# Patient Record
Sex: Male | Born: 1990
Health system: Southern US, Community
[De-identification: ages and names within clinical notes are randomized; demographics above are authoritative.]

## PROBLEM LIST (undated history)

## (undated) DIAGNOSIS — F32A Depression, unspecified: Secondary | ICD-10-CM

## (undated) DIAGNOSIS — Q676 Pectus excavatum: Secondary | ICD-10-CM

## (undated) DIAGNOSIS — F419 Anxiety disorder, unspecified: Secondary | ICD-10-CM

## (undated) DIAGNOSIS — R29898 Other symptoms and signs involving the musculoskeletal system: Secondary | ICD-10-CM

## (undated) DIAGNOSIS — F329 Major depressive disorder, single episode, unspecified: Secondary | ICD-10-CM

## (undated) DIAGNOSIS — K4090 Unilateral inguinal hernia, without obstruction or gangrene, not specified as recurrent: Secondary | ICD-10-CM

## (undated) HISTORY — DX: Major depressive disorder, single episode, unspecified: F32.9

## (undated) HISTORY — DX: Anxiety disorder, unspecified: F41.9

## (undated) HISTORY — DX: Depression, unspecified: F32.A

---
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
## 2008-09-22 HISTORY — PX: PECTUS BAR REMOVAL: SHX2183

## 2012-05-28 ENCOUNTER — Emergency Department (HOSPITAL_COMMUNITY)
Admission: EM | Admit: 2012-05-28 | Discharge: 2012-05-29 | Disposition: A | Discharge status: Home / Self Care | Payer: BC Managed Care – PPO | Attending: Emergency Medicine | Admitting: Emergency Medicine

## 2012-05-28 DIAGNOSIS — M542 Cervicalgia: Secondary | ICD-10-CM

## 2012-05-28 HISTORY — DX: Pectus excavatum: Q67.6

## 2012-05-29 ENCOUNTER — Emergency Department (HOSPITAL_COMMUNITY): Payer: BC Managed Care – PPO

## 2012-05-29 ENCOUNTER — Encounter (HOSPITAL_COMMUNITY): Payer: Self-pay | Admitting: *Deleted

## 2012-05-29 MED ORDER — CYCLOBENZAPRINE HCL 10 MG PO TABS: 10.0000 mg | ORAL_TABLET | Freq: Two times a day (BID) | ORAL | Status: AC | PRN

## 2012-05-29 MED ORDER — LORAZEPAM 1 MG PO TABS
1.0000 mg | ORAL_TABLET | Freq: Once | ORAL | Status: AC
  Administered 2012-05-29: 1 mg via ORAL
  Filled 2012-05-29: qty 1

## 2012-05-29 MED ORDER — HYDROXYZINE HCL 25 MG PO TABS: 25.0000 mg | ORAL_TABLET | Freq: Four times a day (QID) | ORAL | Status: AC

## 2012-05-29 MED ORDER — IBUPROFEN 800 MG PO TABS: 800.0000 mg | ORAL_TABLET | Freq: Three times a day (TID) | ORAL | Status: AC

## 2012-05-29 NOTE — ED Provider Notes (Signed)
History     CSN: [id number]  Arrival date & time 05/28/12  2357   First MD Initiated Contact with Patient 05/29/12 0015      Chief Complaint  Patient presents with  . Neck Pain    (Consider location/radiation/quality/duration/timing/severity/associated sxs/prior treatment) HPI HX per PT. R sided neck discomfort for the last 24 hours, no truama or injury, had been doing some stretching the day before but does not recall injuring it. He went to an urgent care and was told that he may have Ehlers Danlos Syndrome (his mother was diagnosed with this and he has h/o pectus excavatum). He went home and had been reading extensively about this syndrome, became anxious, short of breath, told his sig other that he feels like he is going to die and she brought him here for evaluation.  He no longer has any neck pain, no associated weakness or numbness. He states he still feels anxious but no h/o panic attack or anxiety.  Past Medical History  Diagnosis Date  . Pectus excavatum   . Pneumothorax     Past Surgical History  Procedure Date  . Nuss procedure     History reviewed. No pertinent family history.  History  Substance Use Topics  . Smoking status: Not on file  . Smokeless tobacco: Not on file  . Alcohol Use: Yes     occasionally      Review of Systems  Constitutional: Negative for fever and chills.  HENT: Negative for neck stiffness.   Eyes: Negative for pain.  Respiratory: Negative for shortness of breath.   Cardiovascular: Negative for chest pain.  Gastrointestinal: Negative for abdominal pain.  Genitourinary: Negative for dysuria.  Musculoskeletal: Negative for back pain, joint swelling and gait problem.  Skin: Negative for rash.  Neurological: Negative for headaches.  Psychiatric/Behavioral: The patient is nervous/anxious.   All other systems reviewed and are negative.    Allergies  Shellfish allergy  Home Medications  No current outpatient prescriptions on  file.  BP 135/76  Pulse 99  Temp 97.7 F (36.5 C) (Oral)  Resp 16  Ht 6\' 1"  (1.854 m)  Wt 180 lb (81.647 kg)  BMI 23.75 kg/m2  SpO2 98%  Physical Exam  Constitutional: He is oriented to person, place, and time. He appears well-developed and well-nourished.  HENT:  Head: Normocephalic and atraumatic.  Eyes: Conjunctivae and EOM are normal. Pupils are equal, round, and reactive to light.  Neck: Trachea normal and normal range of motion. Neck supple. No thyromegaly present.       Localizes discomfort to R paracervical upper C spine without any deformity, no tenderness, and no apparent abnormality on exam.   Cardiovascular: Normal rate, regular rhythm, S1 normal, S2 normal and normal pulses.     No systolic murmur is present   No diastolic murmur is present  Pulses:      Radial pulses are 2+ on the right side, and 2+ on the left side.  Pulmonary/Chest: Effort normal and breath sounds normal. He has no wheezes. He has no rhonchi. He has no rales. He exhibits no tenderness.  Abdominal: Soft. Normal appearance and bowel sounds are normal. There is no tenderness. There is no CVA tenderness and negative Murphy's sign.  Musculoskeletal:       Hyper-flexible joints, hyperelastic skin  Neurological: He is alert and oriented to person, place, and time. He has normal strength. No cranial nerve deficit or sensory deficit. GCS eye subscore is 4. GCS verbal subscore is 5.  GCS motor subscore is 6.  Skin: Skin is warm and dry. No rash noted. He is not diaphoretic.  Psychiatric: His speech is normal.       Cooperative and appropriate    ED Course  Procedures (including critical care time)  Given degree of anxiety, I offered ativan imaging of his neck. PT declines imaging and agrees to have ativan.   On exam has no sig tenderness or N/V deficits. Although he does carry the diagnosis of Ehlers Danlos syndrome, he does have some features of it on exam with hyper-flexible joints in his fingers,  hyperelastic skin. He is aware of risk of joint problems with the syndrome, especially in his neck but does not feel like he needs an xray or other imaging at this time. PCP referral provided.   PT changed his mind and xray ordered and reviewed as below  Dg Cervical Spine Complete  05/29/2012  *RADIOLOGY REPORT*  Clinical Data: Right posterior neck pain.  Possible straining during yoga.  CERVICAL SPINE - COMPLETE 4+ VIEW  Comparison: None.  Findings: There is slight reversal of the usual cervical lordosis and angulation of the cervical spine towards the left which may be due to patient positioning but can be also seen with ligamentous injury or muscle spasm.  No abnormal anterior subluxation.  Facet joints demonstrate normal alignment.  No vertebral compression deformities.  No prevertebral soft tissue swelling.  Intervertebral disc space heights are maintained.  Lateral masses of C1 appear symmetrical.  The odontoid process appears intact.  IMPRESSION: Reversal of the usual cervical lordosis and angulation of the cervical spine towards the left could be due to patient positioning but ligamentous injury or muscle spasm can also have this appearance.  No displaced fractures identified.   Original Report Authenticated By: Marlon Pel, M.D.    Xray results shared with PT. Plan outpatient follow up for further evaluation. Ambulates to restroom NAD and no change normal neuro exam. Presentation does not suggest dissection, and exam does suggest need for emergent MRI or CT scan.  MDM   VS and nursing notes reviewed. Imaging and PO Ativan. Outpatient referrals.        Sunnie Nielsen, MD 05/29/12 [phone]

## 2012-05-29 NOTE — ED Notes (Signed)
Pt c/o sharp neck pain on R side which he relates to possible yoga injury, occurring yesterday. Pt states continued and unimproved pain and he feels light-headed and 'out of it'.

## 2012-06-15 ENCOUNTER — Ambulatory Visit: Payer: BC Managed Care – PPO

## 2012-06-15 ENCOUNTER — Ambulatory Visit: Payer: BC Managed Care – PPO | Admitting: Family Medicine

## 2012-06-15 VITALS — BP 130/70 | HR 70 | Temp 98.1°F | Resp 16 | Ht 73.0 in | Wt 171.0 lb

## 2012-06-15 DIAGNOSIS — R5383 Other fatigue: Secondary | ICD-10-CM

## 2012-06-15 DIAGNOSIS — S139XXA Sprain of joints and ligaments of unspecified parts of neck, initial encounter: Secondary | ICD-10-CM

## 2012-06-15 DIAGNOSIS — R5381 Other malaise: Secondary | ICD-10-CM

## 2012-06-15 DIAGNOSIS — R55 Syncope and collapse: Secondary | ICD-10-CM

## 2012-06-15 DIAGNOSIS — R42 Dizziness and giddiness: Secondary | ICD-10-CM

## 2012-06-15 LAB — POCT CBC
Hemoglobin: 15.6 g/dL (ref 14.1–18.1)
Lymph, poc: 2.7 (ref 0.6–3.4)
MCH, POC: 29.4 pg (ref 27–31.2)
MCHC: 32.3 g/dL (ref 31.8–35.4)
MCV: 91 fL (ref 80–97)
MID (cbc): 0.4 (ref 0–0.9)
MPV: 9 fL (ref 0–99.8)
POC MID %: 6.2 %M (ref 0–12)
RBC: 5.31 M/uL (ref 4.69–6.13)
WBC: 6.3 10*3/uL (ref 4.6–10.2)

## 2012-06-15 NOTE — Progress Notes (Signed)
Subjective:    Patient ID: Jared Alvarado, male    DOB: Aug 21, 1991, 21 y.o.   MRN: [id number]  HPI  3 wks prev pt went to ER w/ neck pain  - felt really out of it, disconnected to reality, like he was going pass out - whenever he bent his neck back as far as he could it was reproduce these symptoms - induces panic attack and anxiety, like he is presyncopal.  Pain with looking to either side or down. Thinks perhaps he had hurt his neck doing yoga as lateral that day dev intense pain at R side of neck, worsening for 24 hrs prior to ER visit. The same feeling of panic/anxiety/dizziness last night - unsure if a head movement triggered this. Prior to this pt has never had any injury. Has nuss procedure for pectus excavatum at 21 yo and removed at 21 yo. He did have a pneumothorax and he did have similar symptoms then. He wants to make sure that nothing else is going on.  Did some light weight exercises and gentle stretching to neck w/ her personal trainer last wk and symptoms seem to be relieved some but now pain is worse and he had another panic attack last night.  At the ER he was prescribed meds but didn't fill until last night - hydroxyzine and flexeril. Tried one hydroxyzine today.   Hard to separate anxiety and pain anymore as one seems to trigger another.  Has had extreme [facility] but unsure if induced by anxiety or vice versa. Right before he falls asleep jolts awake w/ panic. Very difficult to find position to not induce this.  Saw ortho today - called and got worked in - so he suggested PCP, rheumatologist due to diffuse body pain and hypermobile joints and a neurologist as. He has pain all over his body. Was referred to PCP Dr. Allyne Gee.  Has extreme tightness in mouth which causes difficulty breathing at night while sleeping. Pains, pale skin, hypermobile - concerned about Ehler-Danlos syndrome.  He has been tested for Marfans syndrome in the past by a cardiologist as he had MVP but this  resolved after his nuss procedure relieved the sternal pressure on his heart.    Review of Systems  Constitutional: Positive for fatigue. Negative for fever.  HENT: Positive for neck pain and neck stiffness.   Respiratory: Positive for shortness of breath. Negative for cough.   Cardiovascular: Negative for chest pain and leg swelling.  Gastrointestinal: Negative for diarrhea and constipation.  Musculoskeletal: Positive for myalgias and arthralgias. Negative for back pain, joint swelling and gait problem.  Skin: Positive for pallor. Negative for color change and rash.  Neurological: Positive for dizziness and light-headedness.  Psychiatric/Behavioral: Positive for disturbed wake/sleep cycle and decreased concentration. Negative for behavioral problems and dysphoric mood. The patient is nervous/anxious.        Objective:   Physical Exam  Vitals reviewed. Constitutional: He is oriented to person, place, and time. He appears well-developed and well-nourished. No distress.  HENT:  Head: Normocephalic and atraumatic.  Right Ear: External ear normal.  Left Ear: External ear normal.  Eyes: Conjunctivae normal are normal. No scleral icterus.  Neck: Neck supple. No thyromegaly present.  Cardiovascular: Normal rate, regular rhythm, normal heart sounds and intact distal pulses.   No murmur heard. Pulses:      Carotid pulses are 2+ on the right side, and 2+ on the left side. Pulmonary/Chest: Effort normal and breath sounds normal. No respiratory distress.  Musculoskeletal:  He exhibits no edema.  Lymphadenopathy:    He has no cervical adenopathy.  Neurological: He is alert and oriented to person, place, and time.  Skin: Skin is warm and dry. He is not diaphoretic. No erythema. There is pallor.  Psychiatric: He has a normal mood and affect. His behavior is normal.      Results for orders placed in visit on 06/15/12  POCT CBC      Component Value Range   WBC 6.3  4.6 - 10.2 K/uL    Lymph, poc 2.7  0.6 - 3.4   POC LYMPH PERCENT 42.1  10 - 50 %L   MID (cbc) 0.4  0 - 0.9   POC MID % 6.2  0 - 12 %M   POC Granulocyte 3.3  2 - 6.9   Granulocyte percent 51.7  37 - 80 %G   RBC 5.31  4.69 - 6.13 M/uL   Hemoglobin 15.6  14.1 - 18.1 g/dL   HCT, POC 04.5  40.9 - 53.7 %   MCV 91.0  80 - 97 fL   MCH, POC 29.4  27 - 31.2 pg   MCHC 32.3  31.8 - 35.4 g/dL   RDW, POC 81.1     Platelet Count, POC 258  142 - 424 K/uL   MPV 9.0  0 - 99.8 fL  POCT SEDIMENTATION RATE      Component Value Range   POCT SED RATE 5  0 - 22 mm/hr     CXR: hyperinflation with flattened diaphragms, no acute abnormalities. Assessment & Plan:  1. SHoB - Pt reassured that no acute concerns seen.  Agree w/ pt that more complete eval for Ehlers-Danlos is reasonable. Will defer to the PCP and specialists that he has been referred to but pt welcome to return if he would like to proceed. 2. Presyncope - Agreed w/ pt that he needed to start on a thorough w/u for possible causes of his lightheadedness, presyncope w/ full extension of his neck - eval of vagal nerve and vertebral artery likely helpful. As no acute issues, will defer w/u to his PCP and specialists (has neuro and rheum appts pending) as discussed above but pt welcome to return if sxs continue or worsen.

## 2012-10-18 ENCOUNTER — Other Ambulatory Visit: Payer: Self-pay | Admitting: Family Medicine

## 2012-10-18 DIAGNOSIS — B356 Tinea cruris: Secondary | ICD-10-CM

## 2012-10-18 MED ORDER — NYSTATIN 100000 UNIT/GM EX CREA: TOPICAL_CREAM | Freq: Two times a day (BID) | CUTANEOUS | Status: DC

## 2012-10-18 MED ORDER — FLUCONAZOLE 150 MG PO TABS: 150.0000 mg | ORAL_TABLET | Freq: Once | ORAL | Status: DC

## 2012-10-25 ENCOUNTER — Ambulatory Visit: Payer: BC Managed Care – PPO | Admitting: Family Medicine

## 2012-10-25 VITALS — BP 126/72 | HR 88 | Temp 98.6°F | Resp 16 | Ht 73.0 in | Wt 173.0 lb

## 2012-10-25 DIAGNOSIS — J069 Acute upper respiratory infection, unspecified: Secondary | ICD-10-CM

## 2012-10-25 DIAGNOSIS — H6980 Other specified disorders of Eustachian tube, unspecified ear: Secondary | ICD-10-CM

## 2012-10-25 DIAGNOSIS — M5382 Other specified dorsopathies, cervical region: Secondary | ICD-10-CM

## 2012-10-25 LAB — POCT CBC
Granulocyte percent: 72.9 %G (ref 37–80)
HCT, POC: 45.1 % (ref 43.5–53.7)
Lymph, poc: 1.2 (ref 0.6–3.4)
MCHC: 32.6 g/dL (ref 31.8–35.4)
MPV: 8.3 fL (ref 0–99.8)
POC Granulocyte: 4.4 (ref 2–6.9)
POC LYMPH PERCENT: 19.3 %L (ref 10–50)
POC MID %: 7.8 %M (ref 0–12)
Platelet Count, POC: 235 10*3/uL (ref 142–424)
RDW, POC: 13.6 %

## 2012-10-25 MED ORDER — FLUTICASONE PROPIONATE 50 MCG/ACT NA SUSP: 2.0000 | Freq: Every day | NASAL | Status: DC

## 2012-10-25 NOTE — Progress Notes (Signed)
Subjective:    Patient ID: Jared Alvarado, male    DOB: 1991-02-13, 22 y.o.   MRN: [id number]  HPI  Jared Alvarado is a 22 yo who over the past 6 mos has had increasing problems with his neck. His neck felt weak and like it couldn't hold his head up. I saw him for this last Sept and prior to that he had been seen at [facility], [facility], and he also ended up seeing a general practitioner (only 1 time, he does not remember who or where), a orthopedic surgeon, a neurologist, AND a rheumatologist for this.  He does not really know what came of all this evaluaiton - he had several sets of imaging done and a lot of labs and ended up being told that it was muscular in origin - like a really severe muscle spasm, which is fine with him if that is the accurate diagnosis - but he is not completely convinced of this.  He felt like someone was stabbing him in the back of his neck constantly and that his neck was weak.  He would have episodes when his neck was bothering him so much that he felt like he couldn't understand people, could not process what was being said to him, brightness of the room would bother him, sometimes the room felt like it was moving, felt like he was going to pass out.  Had a headache that felt like tightness over his head.  He had one episodes where all of a sudden both of his arms went numb and so he called the neurologist who told him he could be having a dissection so went to the ER for a stat CT which was fortunately normal then sxs resolved spontaneously after an hr - he still doesn't know what caused it.  His sxs were so weird and severe and no doctor could tell him what was wrong with him that he actually became suicidal for a few mos - still tears up thinking/talking about how worried and hopeless it seemed. When his neck problems started he has lost about 35 lbs in 2 mos - he had been working out a lot and eating a high calorie diet with lots of protein - then he decided that he didn't want to be so bulked  up so switched to a normal calorie diet (like 1/3 of what he was eating before) and just did cardio. In the past sev mos, his symptoms have gotten much better - he thinks because maybe he has been working out again which was very painful to his neck initially - until today, when he was sitting at work and all of a sudden the sxs happened again - felt like he couldn't concentrate, became dizzy while looking at the computer screen, felt like his neck was going to be unable to hold his head up - sxs were so severe, he had to leave work and came here for further eval. He does not remember any of the doctors he saw and does not really have any info from his prior w/us. He has tried sev different muscle relaxants for his neck - none of which seemed to help at all He still feels that something is very wrong with his body.  This is concerning to him because he has never had anxiety or concerns about his health in the past.  In fact, he had a pneumothorax for a month and he reports he just knew that his breathing and chest didn't  feel right so saw multiple doctors - feels like he finally sort of forced one into doing a CXR - and thereby found out that his lung was partially collapsed and he actually have to have a chest tube placed. Reports he didn't feel this level of depression/anxiety/concern with that incident even though he knew something was wrong when doctors weren't finding it so doesn't know why he is feeling this with this condition. He did have a h/o pectus excavatum s/p nuss procedure which has left his right chest larger than his left - had deformed his rib cage and he wonders if this could be playing into his symptoms. He has not worked out at all in the past several wks and wonders if that could be why he had sxs recur today. Also, he is having some pain over the area of his prior chest tube. Incidentally, he also thinks he is getting a cold, his girlfriend developed a cold about 4d ago and he did take  some allegra d this a.m. to help w/ the sxs.  Past Medical History  Diagnosis Date  . Pectus excavatum   . Pneumothorax    Current Outpatient Prescriptions on File Prior to Visit  Medication Sig Dispense Refill  . fexofenadine (ALLEGRA) 180 MG tablet Take 180 mg by mouth daily.      Marland Kitchen acetaminophen (TYLENOL) 325 MG tablet Take 325 mg by mouth every 6 (six) hours as needed. For pain      . fluticasone (FLONASE) 50 MCG/ACT nasal spray Place 2 sprays into the nose daily.  16 g  6  . nystatin cream (MYCOSTATIN) Apply topically 2 (two) times daily.  30 g  11   Allergies  Allergen Reactions  . Shellfish Allergy      Review of Systems  Constitutional: Positive for unexpected weight change. Negative for fever, chills, activity change and appetite change.  HENT: Positive for neck pain and neck stiffness.   Eyes: Positive for photophobia.  Respiratory: Negative for cough and shortness of breath.   Cardiovascular: Negative for chest pain.  Genitourinary: Positive for flank pain. Negative for dysuria, urgency, hematuria, decreased urine volume, discharge and difficulty urinating.  Musculoskeletal: Positive for myalgias and arthralgias. Negative for back pain, joint swelling and gait problem.  Skin: Negative for rash.  Neurological: Positive for dizziness, weakness, light-headedness and headaches. Negative for facial asymmetry and numbness.  Psychiatric/Behavioral: Positive for suicidal ideas, confusion, sleep disturbance, dysphoric mood and decreased concentration. Negative for self-injury and agitation. The patient is nervous/anxious. The patient is not hyperactive.       BP 126/72  Pulse 88  Temp 98.6 F (37 C) (Oral)  Resp 16  Ht 6\' 1"  (1.854 m)  Wt 173 lb (78.472 kg)  BMI 22.82 kg/m2 Objective:   Physical Exam  Constitutional: He is oriented to person, place, and time. He appears well-developed and well-nourished. No distress.  HENT:  Head: Normocephalic and atraumatic.  Right  Ear: External ear and ear canal normal. A middle ear effusion is present.  Left Ear: External ear and ear canal normal. Tympanic membrane is retracted. A middle ear effusion is present.  Nose: Nose normal.  Mouth/Throat: Oropharynx is clear and moist and mucous membranes are normal. No oropharyngeal exudate.  Eyes: Conjunctivae normal are normal. No scleral icterus.  Neck: Normal range of motion. Neck supple. No thyromegaly present.  Cardiovascular: Normal rate, regular rhythm, normal heart sounds and intact distal pulses.   Pulmonary/Chest: Effort normal and breath sounds normal. No respiratory  distress.  Abdominal: Soft. Bowel sounds are normal. He exhibits no distension and no mass. There is no tenderness. There is no rebound and no guarding.  Musculoskeletal: He exhibits no edema.       Cervical back: He exhibits pain and spasm. He exhibits normal range of motion and no bony tenderness.       Tenderness over right paraspinal cervical muscles  Lymphadenopathy:    He has no cervical adenopathy.  Neurological: He is alert and oriented to person, place, and time. He has normal strength and normal reflexes. He displays no atrophy and normal reflexes. No sensory deficit. He exhibits normal muscle tone. Coordination and gait normal.  Skin: Skin is warm and dry. He is not diaphoretic. No erythema.  Psychiatric: He has a normal mood and affect. His behavior is normal.      Results for orders placed in visit on 10/25/12  POCT CBC      Component Value Range   WBC 6.0  4.6 - 10.2 K/uL   Lymph, poc 1.2  0.6 - 3.4   POC LYMPH PERCENT 19.3  10 - 50 %L   MID (cbc) 0.5  0 - 0.9   POC MID % 7.8  0 - 12 %M   POC Granulocyte 4.4  2 - 6.9   Granulocyte percent 72.9  37 - 80 %G   RBC 4.93  4.69 - 6.13 M/uL   Hemoglobin 14.7  14.1 - 18.1 g/dL   HCT, POC 16.1  09.6 - 53.7 %   MCV 91.5  80 - 97 fL   MCH, POC 29.8  27 - 31.2 pg   MCHC 32.6  31.8 - 35.4 g/dL   RDW, POC 04.5     Platelet Count, POC 235   142 - 424 K/uL   MPV 8.3  0 - 99.8 fL    Assessment & Plan:   1. URI (upper respiratory infection)  POCT CBC  2. Neck muscle weakness  Ambulatory referral to Physical Therapy  3. Eustachian tube dysfunction  fluticasone (FLONASE) 50 MCG/ACT nasal spray  Perhaps a middle ear problem could be contributing to his dizziness sxs?? Try trx w/ flonase.  By history and exam, it does seem most likely that his neck complaints are MSK in origin so I strongly suggest that pt start formal PT - perhaps even [place] or TENS treatment or other could help with his severe muscle spasms.  I also asked pt to check with his insurance co to find out the names of the other providers he has seen - GP, rheum, neuro, ortho, [facility] and come by our office to sign releases so I can gather all his prev info.  I'm sure he has had a lot of testing done but I want to make sure nothing was overlooked which can happen when a pt just has 1 visit w/ a lot of different doctors - he needs a physician to look at everything - all reports and labs - put together and make sure no obscure diagnosis (like Wilson's disease or Ehlers-Danos (which his mom has)) was overlooked. Meds ordered this encounter  Medications  . fluticasone (FLONASE) 50 MCG/ACT nasal spray    Sig: Place 2 sprays into the nose daily.    Dispense:  16 g    Refill:  6

## 2012-10-25 NOTE — Patient Instructions (Signed)
I recommend frequent warm salt water gargles, hot tea with honey and lemon, rest, and handwashing.  Hot showers or breathing in steam may help loosen the congestion.  Try a netti pot or sinus rinse is also likely to help you feel better and keep this from progressing.

## 2013-01-17 ENCOUNTER — Ambulatory Visit (INDEPENDENT_AMBULATORY_CARE_PROVIDER_SITE_OTHER): Payer: BC Managed Care – PPO | Admitting: Family Medicine

## 2013-01-17 VITALS — BP 140/80 | HR 91 | Temp 98.0°F | Resp 16 | Ht 73.5 in | Wt 180.6 lb

## 2013-01-17 DIAGNOSIS — Z0000 Encounter for general adult medical examination without abnormal findings: Secondary | ICD-10-CM

## 2013-01-17 LAB — COMPREHENSIVE METABOLIC PANEL
ALT: 20 U/L (ref 0–53)
BUN: 11 mg/dL (ref 6–23)
CO2: 28 mEq/L (ref 19–32)
Calcium: 10.2 mg/dL (ref 8.4–10.5)
Chloride: 103 mEq/L (ref 96–112)
Creat: 0.92 mg/dL (ref 0.50–1.35)
Glucose, Bld: 94 mg/dL (ref 70–99)
Total Bilirubin: 0.8 mg/dL (ref 0.3–1.2)

## 2013-01-17 LAB — POCT CBC
HCT, POC: 47.7 % (ref 43.5–53.7)
Hemoglobin: 15 g/dL (ref 14.1–18.1)
Lymph, poc: 1.7 (ref 0.6–3.4)
MCH, POC: 28.9 pg (ref 27–31.2)
MCHC: 31.4 g/dL — AB (ref 31.8–35.4)
MCV: 91.9 fL (ref 80–97)
MPV: 8.5 fL (ref 0–99.8)
RBC: 5.19 M/uL (ref 4.69–6.13)
WBC: 7.1 10*3/uL (ref 4.6–10.2)

## 2013-01-17 LAB — LIPID PANEL
Cholesterol: 165 mg/dL (ref 0–200)
Total CHOL/HDL Ratio: 3.1 Ratio
Triglycerides: 138 mg/dL (ref <150)

## 2013-01-17 LAB — HEPATITIS C ANTIBODY: HCV Ab: NEGATIVE

## 2013-01-17 LAB — HIV ANTIBODY (ROUTINE TESTING W REFLEX): HIV: NONREACTIVE

## 2013-01-17 NOTE — Progress Notes (Signed)
[facility] [address], [place] [address] [phone]  Date:  01/17/2013   Name:  Jared Alvarado   DOB:  Jan 27, 1991   MRN:  [id number]  PCP:  Norberto Sorenson, MD    Chief Complaint: complete physical   History of Present Illness:  Jared Alvarado is a 22 y.o. very pleasant male patient who presents with the following:  He needs a PE for his HSA today.  He had been SA with his long- term GF.  They were together for 6 years.  However, about one month ago he found out that she had been unfaithful and ended the relationship. This has been a tough break- up.  He does feel that he is getting better, but admits to feelings of depression.  He is still exercising regularly, and has a good support system.  He denies any SI.  He does not desire medication or formal counseling today, but will keep these things in mind.  He would like an STI check today.    He had some "jock itch" about 6 months ago and used an OTC remedy.  This seemed to clear up, but he is concerned that it may have come back.  He notes burning on the side of his penis. No discharge or other symptoms.   He also recently experienced some impotence issues.  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  and has noted some trouble with getting erection by himself as well.  He never had any problems prior to the recent break- up.    He is not fasting today. He does not smoke, occasionally uses alcohol and MJ.    He has surgery for a pneumothorax in 2008.  Otherwise he is healthy without any significant medical history.   tdap within the last 5 years per his report   There is no problem list on file for this patient.   Past Medical History  Diagnosis Date  . Pectus excavatum   . Pneumothorax   . Allergy   . Anxiety     Past Surgical History  Procedure Laterality Date  . Nuss procedure    . Nuss procedure      History  Substance Use Topics  . Smoking status: Never Smoker    . Smokeless tobacco: Never Used  . Alcohol Use: Yes     Comment: occasionally    History reviewed. No pertinent family history.  Allergies  Allergen Reactions  . Shellfish Allergy     Medication list has been reviewed and updated.  Current Outpatient Prescriptions on File Prior to Visit  Medication Sig Dispense Refill  . fexofenadine (ALLEGRA) 180 MG tablet Take 180 mg by mouth daily.      Marland Kitchen nystatin cream (MYCOSTATIN) Apply topically 2 (two) times daily.  30 g  11   No current facility-administered medications on file prior to visit.    Review of Systems:  As per HPI- otherwise negative.   Physical Examination: Filed Vitals:   01/17/13 0819  BP: 145/81  Pulse: 91  Temp: 98 F (36.7 C)  Resp: 16   Filed Vitals:   01/17/13 0819  Height: 6' 1.5" (1.867 m)  Weight: 180 lb 9.6 oz (81.92 kg)   Body mass index is 23.5 kg/(m^2). Ideal Body Weight: Weight in (lb) to have BMI = 25: 191.7  GEN: WDWN, NAD, Non-toxic, A & O x 3, well- appearing HEENT: Atraumatic, Normocephalic. Neck supple. No masses,  No LAD. Ears and Nose: No external deformity. CV: RRR, No M/G/R. No JVD. No thrill. No extra heart sounds. PULM: CTA B, no wheezes, crackles, rhonchi. No retractions. No resp. distress. No accessory muscle use. ABD: S, NT, ND, +BS. No rebound. No HSM. EXTR: No c/c/e NEURO Normal gait.  PSYCH: Normally interactive. Conversant. Not depressed or anxious appearing.  Calm demeanor.  GU: normal testicles, scroum and penis.  No sign of lesion or discharge. No masses  Assessment and Plan: Physical exam, annual - Plan: POCT CBC, Comprehensive metabolic panel, Lipid panel, GC/Chlamydia Probe Amp, Hepatitis B surface antibody, Hepatitis B surface antigen, Hepatitis C antibody, Herpes simplex virus culture, HIV antibody, HSV(herpes simplex vrs) 1+2 ab-IgG  STI testing and other BW as above.  Discussed his depression/ adjustment to break- up as above.  At this time he declines  medication or counseling.  Discussed impotence issues- very likely due to psychological stressors.  He will give this issue some time to self- resolve, but knows to seek care if this problem persists.    Signed Abbe Amsterdam, MD

## 2013-01-18 LAB — HSV(HERPES SIMPLEX VRS) I + II AB-IGG: HSV 1 Glycoprotein G Ab, IgG: <0.1 IV

## 2013-01-18 LAB — GC/CHLAMYDIA PROBE AMP: GC Probe RNA: NEGATIVE

## 2013-01-20 ENCOUNTER — Encounter: Payer: Self-pay | Admitting: Family Medicine

## 2014-02-04 IMAGING — CR DG CERVICAL SPINE COMPLETE 4+V
5 series · 5 of 5 positions shown · non-contrast
Comparison: None.

CLINICAL DATA: Right posterior neck pain.  Possible straining
during yoga.

CERVICAL SPINE - COMPLETE 4+ VIEW

[w cervical spine lat]
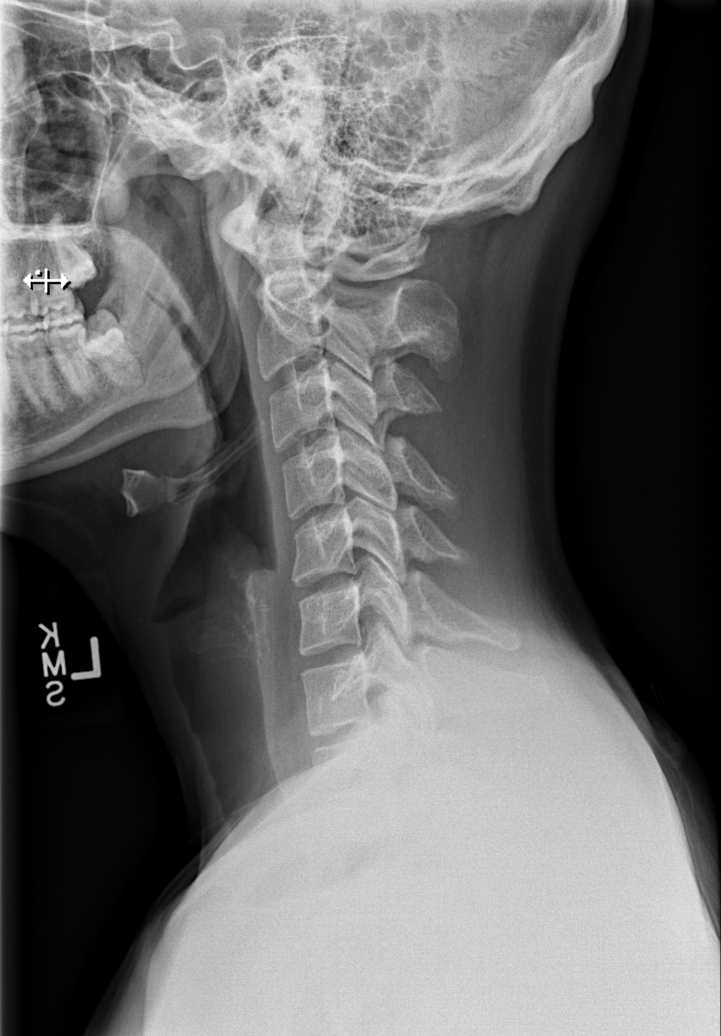

[w cervical spine ap_obl (1 of 2)]
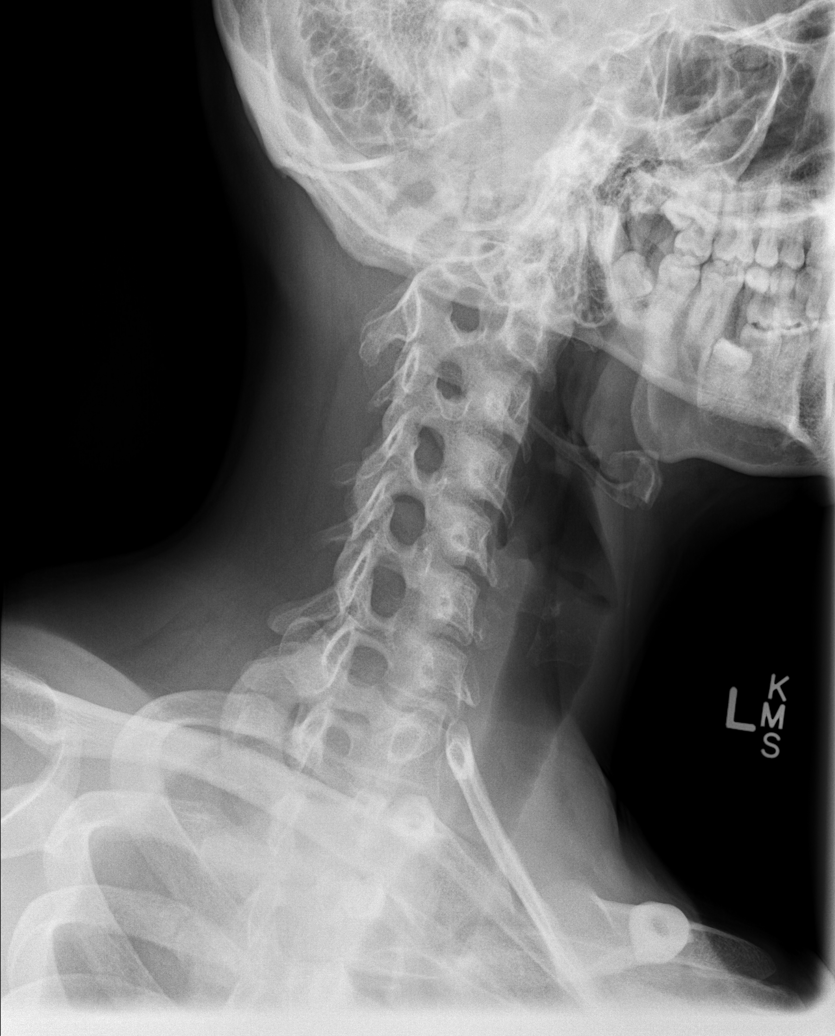

[w cervical spine ap_obl (2 of 2)]
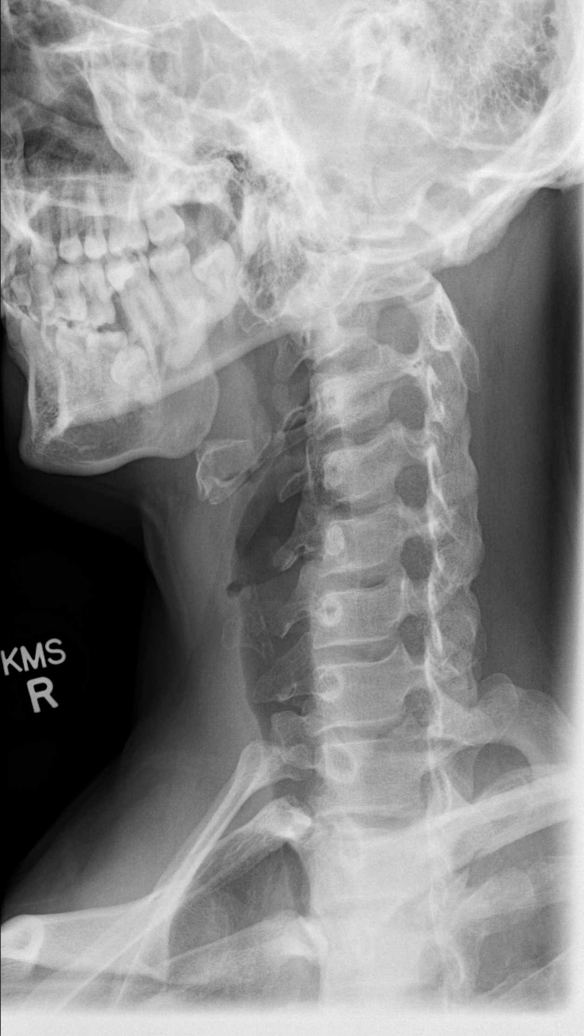

[w cervical spine ap]
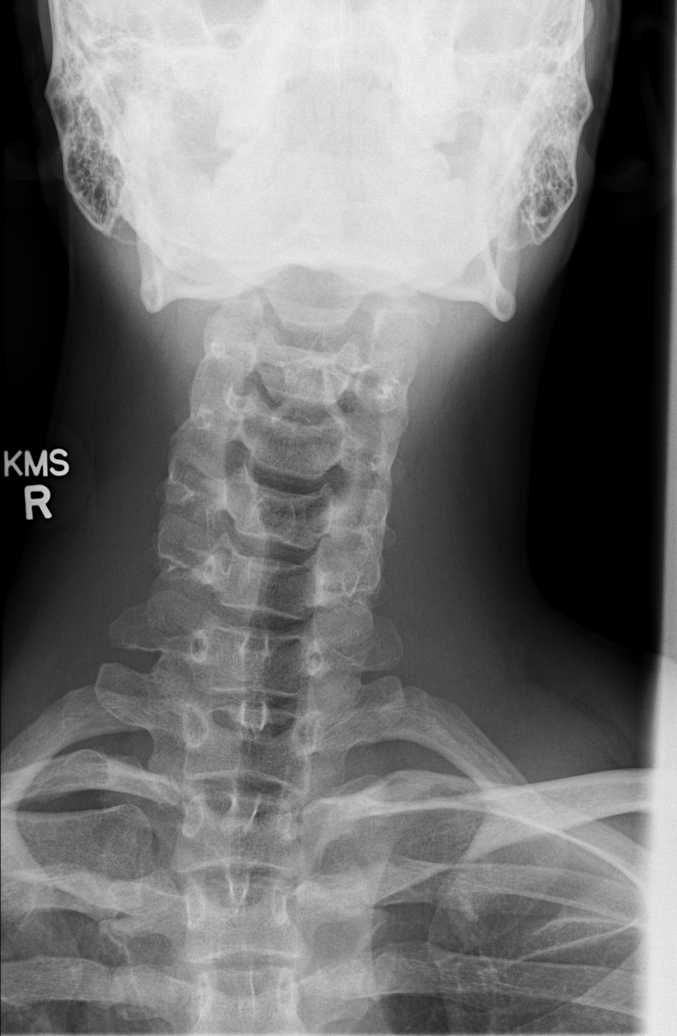

[w cervical spine odontoid]
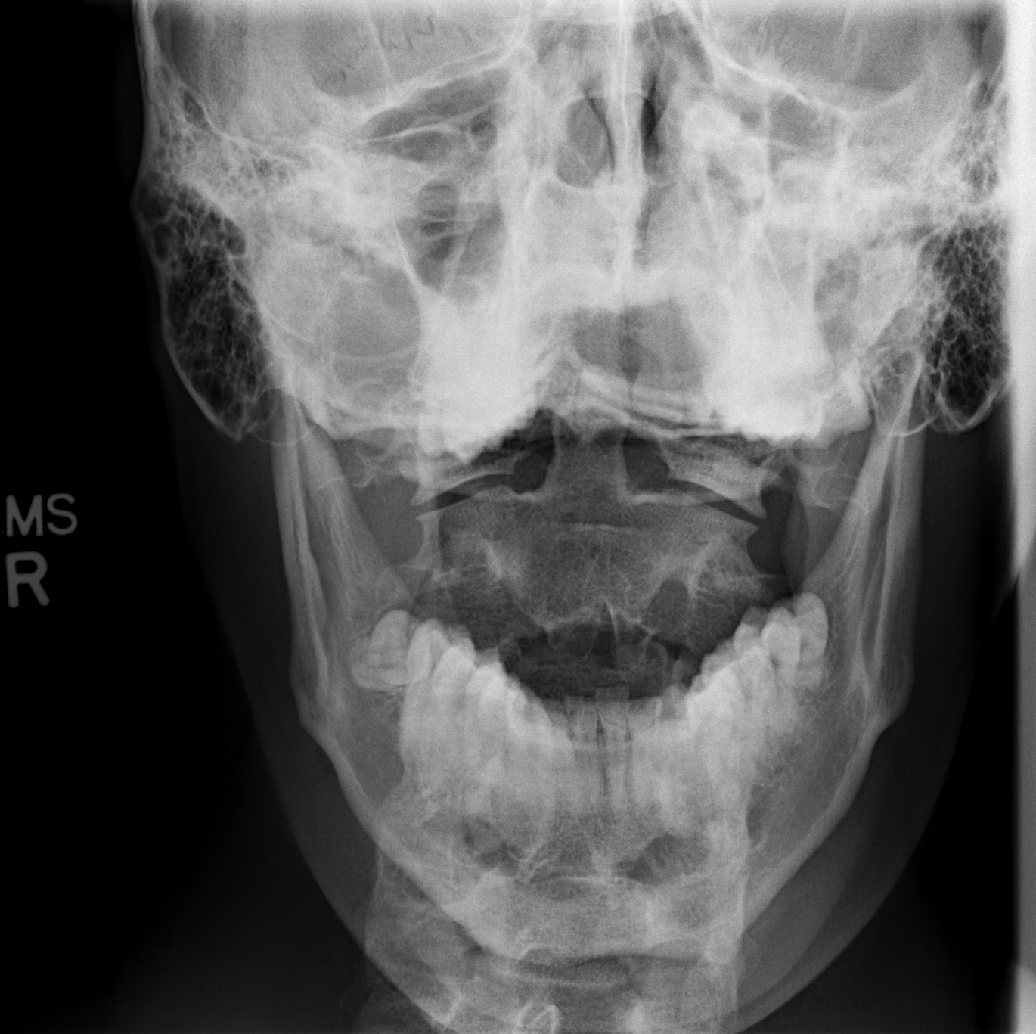

[5 of 5 positions shown; findings below may reference images not displayed]

FINDINGS: There is slight reversal of the usual cervical lordosis
and angulation of the cervical spine towards the left which may be
due to patient positioning but can be also seen with ligamentous
injury or muscle spasm.  No abnormal anterior subluxation.  Facet
joints demonstrate normal alignment.  No vertebral compression
deformities.  No prevertebral soft tissue swelling.  Intervertebral
disc space heights are maintained.  Lateral masses of C1 appear
symmetrical.  The odontoid process appears intact.
IMPRESSION: Reversal of the usual cervical lordosis and angulation of the
cervical spine towards the left could be due to patient positioning
but ligamentous injury or muscle spasm can also have this
appearance.  No displaced fractures identified.

## 2014-11-24 ENCOUNTER — Other Ambulatory Visit: Payer: Self-pay | Admitting: Family Medicine

## 2014-11-24 DIAGNOSIS — R1084 Generalized abdominal pain: Secondary | ICD-10-CM

## 2014-11-24 DIAGNOSIS — R350 Frequency of micturition: Secondary | ICD-10-CM

## 2014-12-01 ENCOUNTER — Ambulatory Visit
Admission: RE | Admit: 2014-12-01 | Discharge: 2014-12-01 | Disposition: A | Discharge status: Home / Self Care | Payer: 59 | Source: Ambulatory Visit | Attending: Family Medicine | Admitting: Family Medicine

## 2014-12-01 ENCOUNTER — Other Ambulatory Visit: Payer: Self-pay | Admitting: Family Medicine

## 2014-12-01 DIAGNOSIS — R1084 Generalized abdominal pain: Secondary | ICD-10-CM

## 2014-12-01 DIAGNOSIS — R350 Frequency of micturition: Secondary | ICD-10-CM

## 2016-05-26 ENCOUNTER — Emergency Department (HOSPITAL_COMMUNITY): Payer: Managed Care, Other (non HMO)

## 2016-05-26 ENCOUNTER — Emergency Department (HOSPITAL_COMMUNITY)
Admission: EM | Admit: 2016-05-26 | Discharge: 2016-05-26 | Disposition: A | Discharge status: Home / Self Care | Payer: Managed Care, Other (non HMO) | Attending: Emergency Medicine | Admitting: Emergency Medicine

## 2016-05-26 ENCOUNTER — Encounter (HOSPITAL_COMMUNITY): Payer: Self-pay | Admitting: Emergency Medicine

## 2016-05-26 DIAGNOSIS — F1290 Cannabis use, unspecified, uncomplicated: Secondary | ICD-10-CM | POA: Insufficient documentation

## 2016-05-26 DIAGNOSIS — R05 Cough: Secondary | ICD-10-CM | POA: Diagnosis present

## 2016-05-26 DIAGNOSIS — R110 Nausea: Secondary | ICD-10-CM | POA: Insufficient documentation

## 2016-05-26 DIAGNOSIS — B9789 Other viral agents as the cause of diseases classified elsewhere: Secondary | ICD-10-CM

## 2016-05-26 DIAGNOSIS — Z79899 Other long term (current) drug therapy: Secondary | ICD-10-CM | POA: Insufficient documentation

## 2016-05-26 DIAGNOSIS — R109 Unspecified abdominal pain: Secondary | ICD-10-CM | POA: Diagnosis not present

## 2016-05-26 DIAGNOSIS — J069 Acute upper respiratory infection, unspecified: Secondary | ICD-10-CM | POA: Diagnosis not present

## 2016-05-26 MED ORDER — ALBUTEROL SULFATE HFA 108 (90 BASE) MCG/ACT IN AERS
2.0000 | INHALATION_SPRAY | Freq: Once | RESPIRATORY_TRACT | Status: AC
  Administered 2016-05-26: 2 via RESPIRATORY_TRACT
  Filled 2016-05-26: qty 6.7

## 2016-05-26 NOTE — ED Provider Notes (Signed)
[facility]-EMERGENCY DEPT Provider Note   CSN: [id number] Arrival date & time: 05/26/16  1737  By signing my name below, I, Linna DarnerRussell Turner, attest that this documentation has been prepared under the direction and in the presence of non-physician practitioner, Jaynie Crumbleatyana Raymondo Garcialopez, PA-C. Electronically Signed: Linna Darnerussell Turner, Scribe. 05/26/2016. 6:42 PM.  History   Chief Complaint Chief Complaint  Patient presents with  . Cough    The history is provided by the patient. No language interpreter was used.     HPI Comments: Jared Alvarado is a 25 y.o. male who presents to the Emergency Department complaining of gradual onset, intermittent, cough over the last several days. Pt notes associated SOB with cough, chest tightness, congestion, cervical lymphadenopathy, nausea, and burning, cramping abdominal pain. Pt reports he was seen at [facility] several weeks ago and was prescribed antibiotics for his cervical lymphadenopathy. [facility] and his cough worsened; he states he was coughing up brown specks. He has tried acetaminophen for his symptoms with minimal relief. He believes his abdominal pain may be gas-related and states his stomach was "bubbling loudly" earlier today. Pt reports his abdominal pain is gradually improving. He denies h/o asthma but states he has used a friend's inhaler when his SOB is most severe with good relief. Pt reports he had 2 alcoholic beverages yesterday and 5 alcoholic beverages 3 days ago. He denies diarrhea, vomiting, fever, chills, or any other associated symptoms.  Past Medical History:  Diagnosis Date  . Allergy   . Anxiety   . Pectus excavatum   . Pneumothorax     There are no active problems to display for this patient.   Past Surgical History:  Procedure Laterality Date  . nuss procedure    . nuss procedure         Home Medications    Prior to Admission medications   Medication Sig Start Date End Date  Taking? Authorizing Provider  fexofenadine (ALLEGRA) 180 MG tablet Take 180 mg by mouth daily.    Historical Provider, MD  nystatin cream (MYCOSTATIN) Apply topically 2 (two) times daily. 10/18/12   Sherren MochaEva N Shaw, MD    Family History No family history on file.  Social History Social History  Substance Use Topics  . Smoking status: Never Smoker  . Smokeless tobacco: Never Used  . Alcohol use Yes     Comment: occasionally     Allergies   Shellfish allergy   Review of Systems Review of Systems  Constitutional: Negative for chills and fever.  HENT: Positive for congestion.   Respiratory: Positive for cough, chest tightness and shortness of breath.   Gastrointestinal: Positive for abdominal pain and nausea. Negative for diarrhea and vomiting.  Hematological: Positive for adenopathy.    Physical Exam Updated Vital Signs BP 126/77 (BP Location: Left Arm)   Pulse 104   Temp 98.7 F (37.1 C) (Oral)   Ht 6\' 2"  (1.88 m)   Wt 180 lb (81.6 kg)   SpO2 100%   BMI 23.11 kg/m   Physical Exam  Constitutional: He is oriented to person, place, and time. He appears well-developed and well-nourished. No distress.  HENT:  Head: Normocephalic and atraumatic.  Eyes: Conjunctivae and EOM are normal.  Neck: Neck supple. No tracheal deviation present.  Cardiovascular: Normal rate, regular rhythm, normal heart sounds and intact distal pulses.  Exam reveals no friction rub.   No murmur heard. Pulmonary/Chest: Effort normal. No respiratory distress. He  has no wheezes. He has no rales.  Abdominal: Soft. Bowel sounds are normal. He exhibits no distension. There is no tenderness. There is no guarding.  Musculoskeletal: Normal range of motion.  Neurological: He is alert and oriented to person, place, and time.  Skin: Skin is warm and dry.  Psychiatric: He has a normal mood and affect. His behavior is normal.  Nursing note and vitals reviewed.  ED Treatments / Results  Labs (all labs ordered  are listed, but only abnormal results are displayed) Labs Reviewed - No data to display  EKG  EKG Interpretation None       Radiology Dg Chest 2 View  Result Date: 05/26/2016 CLINICAL DATA:  Shortness of breath 4 days. EXAM: CHEST  2 VIEW COMPARISON:  PA and lateral chest 06/15/2012. FINDINGS: The lungs are clear. Heart size is normal. No pneumothorax or pleural effusion. No focal bony abnormality. IMPRESSION: No acute disease. Electronically Signed   By: Drusilla Kanner M.D.   On: 05/26/2016 19:31    Procedures Procedures (including critical care time)  DIAGNOSTIC STUDIES: Oxygen Saturation is 100% on RA, normal by my interpretation.    COORDINATION OF CARE: 6:42 PM Discussed treatment plan with pt at bedside and pt agreed to plan.  Medications Ordered in ED Medications - No data to display   Initial Impression / Assessment and Plan / ED Course  I have reviewed the triage vital signs and the nursing notes.  Pertinent labs & imaging results that were available during my care of the patient were reviewed by me and considered in my medical decision making (see chart for details).  Clinical Course    Pt with cough, shortness of breath, epigastric pain that is now resolve.d. Patient also states his shortness of breath has improved since coming here. He reports that he believes he might have had an anxiety and panic attack when was short of breath early. He states he used his girlfriend's inhaler which helped. At this time, patient does not appear to be in any distress. Lungs are clear. Exam is unremarkable. Abdomen is nontender. Will check chest x-ray given shortness of breath and cough for over a week.   Chest x-ray is negative. Patient will be given inhaler to go home with. Otherwise vital signs and exam unremarkable. He appears to be stable. He has no symptoms at this time. Was likely viral upper respiratory infection with panic attack. Home with pcp follow up.   Vitals:    05/26/16 1750 05/26/16 1751  BP: 126/77   Pulse: 104   Temp: 98.7 F (37.1 C)   TempSrc: Oral   SpO2: 100%   Weight:  81.6 kg  Height:  6\' 2"  (1.88 m)    I personally performed the services described in this documentation, which was scribed in my presence. The recorded information has been reviewed and is accurate.   Final Clinical Impressions(s) / ED Diagnoses   Final diagnoses:  Viral URI with cough    New Prescriptions New Prescriptions   No medications on file     Jaynie Crumble, PA-C 05/26/16 2008    Derwood Kaplan, MD 05/27/16 0021

## 2016-05-26 NOTE — Discharge Instructions (Signed)
Use inhaler 2 puffs every 4 hrs as needed. Take over the counter cold and flu medications. Follow up with primary care doctor. Return if worsening.

## 2016-05-26 NOTE — ED Triage Notes (Signed)
Pt present to ED with complaints of increased congestion, productive cough, and minor shortness of breath after coughing episodes. Pt states this started roughly 4 days ago. Pt currently has no fever. Respirations are slightly labored, pt states he has been having episodes of anxiety recently including today.

## 2016-06-04 ENCOUNTER — Ambulatory Visit (INDEPENDENT_AMBULATORY_CARE_PROVIDER_SITE_OTHER): Payer: Self-pay | Admitting: Family Medicine

## 2016-06-04 VITALS — BP 126/82 | HR 85 | Temp 98.0°F | Resp 17 | Ht 74.0 in | Wt 172.0 lb

## 2016-06-04 DIAGNOSIS — Z0000 Encounter for general adult medical examination without abnormal findings: Secondary | ICD-10-CM

## 2016-06-04 DIAGNOSIS — K115 Sialolithiasis: Secondary | ICD-10-CM

## 2016-06-04 NOTE — Patient Instructions (Addendum)
Overall, things look very well.  I think you have a salivary stone.  There are no great treatments for this, but you can try milking the duct through gentle massage, warm compresses, or sucking on hard, tart candies to promote salivary flow.  If this hasn't worked in a couple of days, it's time to see the ENT.    It was good to meet you today!  Salivary Stone A salivary stone is a mineral deposit that builds up in the ducts that drain your salivary glands. Most salivary gland stones are made of calcium. When a stone forms, saliva can back up into the gland and cause painful swelling. Your salivary glands are the glands that produce spit (saliva). You have six major salivary glands. Each gland has a duct that carries saliva into your mouth. Saliva keeps your mouth moist and breaks down the food that you eat. It also helps to prevent tooth decay. Two salivary glands are located just in front of your ears (parotid). The ducts for these glands open up inside your cheeks, near your back teeth. You also have two glands under your tongue (sublingual) and two glands under your jaw (submandibular). The ducts for these glands open under your tongue. A stone can form in any salivary gland. The most common place for a salivary stone to develop is in a submandibular salivary gland. CAUSES Any condition that reduces the flow of saliva may lead to stone formation. It is not known why some people form stones and others do not.  RISK FACTORS You may be more likely to develop a salivary stone if you:  Are male.  Do not drink enough water.  Smoke.  Have high blood pressure.  Have gout.  Have diabetes. SIGNS AND SYMPTOMS The main sign of a salivary gland stone is sudden swelling of a salivary gland when eating. This usually happens under the jaw on one side. Other signs and symptoms include:  Swelling of the cheek or under the tongue when eating.  Pain in the swollen area.  Trouble chewing or  swallowing.  Swelling that goes down after eating. DIAGNOSIS Your health care provider may diagnose a salivary gland stone based on your signs and symptoms. The health care provider will also do a physical exam. In many cases, a stone can be felt in a duct inside your mouth. You may need to see an ear, nose, and throat specialist (ENT or otolaryngologist) for diagnosis and treatment. You may also need to have diagnostic tests. These may include imaging studies to check for a stone, such as:  X-rays.  Ultrasound.  CT scan.  MRI. TREATMENT Home care may be enough to treat a small stone that is not causing symptoms. Treatment of a stone that is large enough to cause symptoms may include:  Probing and widening the duct to allow the stone to pass.  Inserting a thin, flexible scope (endoscope) into the duct to locate and remove the stone.  Breaking up the stone with sound waves.  Removing the entire salivary gland. HOME CARE INSTRUCTIONS  Drink enough fluid to keep your urine clear or pale yellow.  Follow these instructions every few hours:  Suck on a lemon candy to stimulate the flow of saliva.  Put a hot compress over the gland.  Gently massage the gland.  Do not use any tobacco products, including cigarettes, chewing tobacco, or electronic cigarettes. If you need help quitting, ask your health care provider. SEEK MEDICAL CARE IF:  You have  pain and swelling in your face, jaw, or mouth after eating.  You have persistent swelling in any of these places:  In front of your ear.  Under your jaw.  Inside your mouth. SEEK IMMEDIATE MEDICAL CARE IF:  You have pain and swelling in your face, jaw, or mouth that are getting worse.  Your pain and swelling make it hard to swallow or breathe.   This information is not intended to replace advice given to you by your health care provider. Make sure you discuss any questions you have with your health care provider.   Document  Released: 10/16/2004 Document Revised: 09/29/2014 Document Reviewed: 02/08/2014 Elsevier Interactive Patient Education 2016 [employer]     IF you received an x-ray today, you will receive an invoice from [facility]. Please contact [facility] at [phone] with questions or concerns regarding your invoice.   IF you received labwork today, you will receive an invoice from [facility]. Please contact [facility] at [phone] with questions or concerns regarding your invoice.   Our billing staff will not be able to assist you with questions regarding bills from these companies.  You will be contacted with the lab results as soon as they are available. The fastest way to get your results is to activate your My Chart account. Instructions are located on the last page of this paperwork. If you have not heard from us regarding the results in 2 weeks, please contact this office.

## 2016-06-04 NOTE — Progress Notes (Signed)
Jared Alvarado is a 25 y.o. male who [facility] today for a physical exam:  1.  Complete physical exam:  25 yo M with PMH signficiant for pectus excatum repair with complications of hemothorax/pneumothorax s/p repair.  Has not had any issues with this for several years. He is otherwise in good health. Does report that he began to develop several allergies about 4-5 years ago.Describes shellfish, peanut, dust/dustmite which all developed just a few years ago.  Had allergy testing at that time.   Allergy.    LLQ abdominal pain occurred about 2 years ago, lasted for about a year. On Miralax daily then.  Better with several rounds of antibiotics.  Never see definitive diagnosis for this.  He works in [employer]. He travels often for his work. He recently broke up with a long-time girlfriend. He does have some issues adjust to this. He has an appointment set up this coming Friday with a counselor. He denies any suicidal or homicidal ideations. He is exercising more to help with this.  He does report swelling in his throat on the left side that occurs after meals. This is been going on for the past 1-2 weeks. His last 3 meals this has happened every time. Describes this as not really painful. He did have some "small white flecks "when he woke up this morning that he was able to cough out. States these were very hard almost like [place]'s.  ROS as above.  Pertinently, no chest pain, palpitations, SOB, Fever, Chills, Abd pain, N/V/D.   PMH reviewed. Patient is a nonsmoker.   Past Medical History:  Diagnosis Date  . Allergy   . Anxiety   . Depression   . Pectus excavatum   . Pectus excavatum   . Pneumothorax    Past Surgical History:  Procedure Laterality Date  . lung failure    . nuss procedure    . nuss procedure      Medications reviewed. Current Outpatient Prescriptions  Medication Sig Dispense Refill  . fexofenadine (ALLEGRA) 180  MG tablet Take 180 mg by mouth daily.    Marland Kitchen. nystatin cream (MYCOSTATIN) Apply topically 2 (two) times daily. (Patient not taking: Reported on 06/04/2016) 30 g 11   No current facility-administered medications for this visit.      Physical Exam:  BP 126/82 (BP Location: Right Arm, Patient Position: Sitting, Cuff Size: Large)   Pulse 85   Temp 98 F (36.7 C) (Oral)   Resp 17   Ht 6\' 2"  (1.88 m)   Wt 172 lb (78 kg)   SpO2 98%   BMI 22.08 kg/m  Gen:  Alert, cooperative patient who appears stated age in no acute distress.  Vital signs reviewed. Head: [place]/AT.   Eyes:  EOMI, PERRL.   Ears:  External ears WNL, Bilateral TM's normal without retraction, redness or bulging. Nose:  Septum midline  Mouth:  MMM, tonsils non-erythematous, non-edematous.   Neck:  Supple without masses noted currently.  Does have slightly larger submandibular gland on Left as compared to Right.   Pulm:  Clear to auscultation bilaterally with good air movement.  No wheezes or rales noted.   Cardiac:  Regular rate and rhythm without murmur auscultated.  Good S1/S2. Abd:  Soft/nondistended/nontender.  Good bowel sounds throughout all four quadrants.  No masses noted.  Exts: Non edematous BL  LE, warm and well perfused.  Neuro:  Alert and oriented to person, place, and date.  CN II-XII intact.  No focal deficits noted.   Psych:  Not depressed or anxious appearing.  Linear and coherent thought process as evidenced by speech pattern. Smiles spontaneously.     Assessment and Plan:  1.  Adult health maintenance: -He is doing well today. -No real issues except for salivary stone, see below. -If he continues to have intermittent bouts of constipation, mucus in the stool, blood in the stool may need to evaluate for inflammatory bowel disease. However now he is doing well.  #2. Salivary stone: -Most likely explanation for recurrent submandibular swelling -He does have a picture on his phone of the swelling yesterday. It  shows a 3-4 cm nodule located on the lateral aspect of his throat in the submandibular region. -Good today. -Never red or any signs of infection. -See instructions for attempted treatment. -If recurs or continues may need ENT referral. He would like to hold off on this for now - avoid anticholinergics

## 2016-10-02 ENCOUNTER — Ambulatory Visit (INDEPENDENT_AMBULATORY_CARE_PROVIDER_SITE_OTHER): Payer: Managed Care, Other (non HMO) | Admitting: Family Medicine

## 2016-10-02 VITALS — BP 136/80 | HR 81 | Temp 97.6°F | Resp 16 | Ht 74.0 in | Wt 181.0 lb

## 2016-10-02 DIAGNOSIS — S41111A Laceration without foreign body of right upper arm, initial encounter: Secondary | ICD-10-CM | POA: Diagnosis not present

## 2016-10-02 DIAGNOSIS — K115 Sialolithiasis: Secondary | ICD-10-CM | POA: Diagnosis not present

## 2016-10-02 NOTE — Patient Instructions (Addendum)
  I will refer you to the ear, nose, and throat doctor. They will contact you in the next week or so.  Put either Neosporin or bacitracin ointment on your arm for the next week. This should get it cleared up. If not let me know.  It was good to see you again today   IF you received an x-ray today, you will receive an invoice from [facility]. Please contact [facility] at [phone] with questions or concerns regarding your invoice.   IF you received labwork today, you will receive an invoice from [facility]. Please contact [facility] at [phone] with questions or concerns regarding your invoice.   Our billing staff will not be able to assist you with questions regarding bills from these companies.  You will be contacted with the lab results as soon as they are available. The fastest way to get your results is to activate your My Chart account. Instructions are located on the last page of this paperwork. If you have not heard from us regarding the results in 2 weeks, please contact this office.

## 2016-10-02 NOTE — Progress Notes (Signed)
Subjective:    Jared Alvarado is a 26 y.o. male who [facility] today for Right arm wound:  1.  Right arm wound:  Present for several days.  Had tattoo applied to Right biceps region about 1 week ago.  Had several areas of peeling. One of these is concerned became infected. He has noted some mildly purulent drainage. No redness or streaking up his arm. He has used hydrogen peroxide to try to clean it. No actual pain. No fevers or chills. Eating and drinking well. No nausea vomiting.  2.  Salivary stone:  He has still had persistent submandibular swelling on the left. It only occurs every several weeks to months and lasts for 30 minutes or so. It is provoked by spicy foods, dehydration, anticholinergics such as Tylenol PM. It is fairly infrequent. It is painful when it is swollen. He has had no other areas of swelling tenderness or pain elsewhere. No drainage or redness. He has not actually passed any salivary stones that he knows of.  ROS as above per HPI.    The following portions of the patient's history were reviewed and updated as appropriate: allergies, current medications, past medical history, family and social history, and problem list. Patient is a nonsmoker.    PMH reviewed.  Past Medical History:  Diagnosis Date  . Allergy   . Anxiety   . Depression   . Pectus excavatum   . Pectus excavatum   . Pneumothorax    Past Surgical History:  Procedure Laterality Date  . lung failure    . nuss procedure    . nuss procedure    . TALC PLEURODESIS    . TUBE THORACOSTOMY ([facility] HX)      Medications reviewed. No current outpatient prescriptions on file.   No current facility-administered medications for this visit.      Objective:   Physical Exam BP 136/80   Pulse 81   Temp 97.6 F (36.4 C) (Oral)   Resp 16   Ht 6\' 2"  (1.88 m)   Wt 181 lb (82.1 kg)   SpO2 100%   BMI 23.24 kg/m  Gen:  Alert, cooperative patient who appears stated age in no acute distress.  Vital  signs reviewed. Head: [place]/AT.   Eyes:  EOMI, PERRL.   Ears:  External ears WNL, Bilateral TM's normal without retraction, redness or bulging. Nose:  Septum midline  Mouth:  MMM, tonsils non-erythematous, non-edematous.   Neck:  No swelling or pain currently. Skin:  Large tattoo covering his Right shoulder, biceps region, extending down to elbow.  He does have a 1 cm in Diameter but only about 2 mm in diameter open wound on the lateral aspect of his right bicep. This is nontender. No surrounding areas of erythema that I can see through the tattoo ink.  No heat. I'm not able to express any purulence here.  No red streaks or lymphangitis.  No results found for this or any previous visit (from the past 72 hour(s)).  Imp/Plan: 1. Right arm abrasion: - very small/mild.  - stop H2O2 - bacitracin ointment to treat - warning precautions provided  2.  Salivary stone: - persists.  Intermittent.  Nothing on exam today.  - discussed different treatment options. -He has already tried sucking hard candies, sour treats, cutting out anticholinergics, staying hydrated. - asking for referral to ENT.  Will send to establish in case recurs.

## 2016-10-15 ENCOUNTER — Ambulatory Visit (INDEPENDENT_AMBULATORY_CARE_PROVIDER_SITE_OTHER): Payer: Managed Care, Other (non HMO) | Admitting: Family Medicine

## 2016-10-15 VITALS — BP 122/68 | HR 68 | Temp 97.4°F | Resp 16 | Ht 74.0 in | Wt 188.0 lb

## 2016-10-15 DIAGNOSIS — Z0000 Encounter for general adult medical examination without abnormal findings: Secondary | ICD-10-CM | POA: Diagnosis not present

## 2016-10-15 NOTE — Patient Instructions (Addendum)
It was good to see you again today.  If you notice any changes in the numbness of your toe be sure to let me know.  Surgery we will contact you about setting up an appointment within the next week.  I will let you know the results of your lab tests are Mychart.  If you have any other questions or concerns be sure to let me know.    IF you received an x-ray today, you will receive an invoice from [facility]. Please contact [facility] at [phone] with questions or concerns regarding your invoice.   IF you received labwork today, you will receive an invoice from [facility]. Please contact [facility] at [phone] with questions or concerns regarding your invoice.   Our billing staff will not be able to assist you with questions regarding bills from these companies.  You will be contacted with the lab results as soon as they are available. The fastest way to get your results is to activate your My Chart account. Instructions are located on the last page of this paperwork. If you have not heard from us regarding the results in 2 weeks, please contact this office.

## 2016-10-15 NOTE — Progress Notes (Addendum)
Jared Alvarado is a 26 y.o. male who presents to [facility] today for comprehensive physical examination:  CPE  Concerns:  Right testicular pain and Right toe numbness.    1.  Right testicular pain:  Present for about a year. Describes dull ache that is intermittent. Can come once a week or even just once a month. However for the past 2-3 weeks it has been happening almost every day. It is painful now. Pain is 5 out of 10. He has not been able to find a position that relieves the pain. It is not bad enough him to take any medicine.  2.  Right toe numbness:  Present also for about a year. He works out and runs on a regular basis for exercise. He simply noticed this one day. It is the tip of his right toe. He has no numbness elsewhere. No polyuria. Nocturia 1 but not frequently. No paresthesias. No weakness, bilateral upper lower extremity is.  Last physical a year or so ago Eye exam:  Every year or so Dental exam every six months.   PMH reviewed. Patient is a nonsmoker.   Past Medical History:  Diagnosis Date  . Allergy   . Anxiety   . Depression   . Pectus excavatum   . Pectus excavatum   . Pneumothorax    Past Surgical History:  Procedure Laterality Date  . lung failure    . nuss procedure    . nuss procedure    . TALC PLEURODESIS    . TUBE THORACOSTOMY ([facility] HX)      Medications reviewed. No current outpatient prescriptions on file.   No current facility-administered medications for this visit.     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  HENT: Negative for congestion, ear discharge, ear pain and hearing loss.   Eyes: Negative for blurred vision.  Respiratory: Negative for cough  and wheezing.   Cardiovascular: Negative for chest pain, palpitations and leg swelling.  Gastrointestinal: Negative for nausea, vomiting and abdominal pain.  Genitourinary: Negative for dysuria, hematuria and flank pain.  Positive as above for Right testicular pain.  Musculoskeletal: Negative for neck pain.  Skin: Negative for rash.  Neurological: Negative for dizziness and headaches.  Psychiatric/Behavioral: Negative for depression and suicidal ideas.   Exam: BP 122/68   Pulse 68   Temp 97.4 F (36.3 C) (Oral)   Resp 16   Ht 6\' 2"  (1.88 m)   Wt 188 lb (85.3 kg)   SpO2 98%   BMI 24.14 kg/m  Gen:  Alert, cooperative patient who appears stated age in no acute distress.  Vital signs reviewed. Head: [place]/AT.   Eyes:  EOMI, PERRL.   Ears:  External ears WNL, Bilateral TM's normal without retraction, redness or bulging. Nose:  Septum midline  Mouth:  MMM, tonsils non-erythematous, non-edematous.   Neck: No masses or thyromegaly or limitation in range of motion.  No cervical lymphadenopathy. Pulm:  Clear to auscultation bilaterally with good air movement.  No wheezes or rales noted.   Cardiac:  Regular rate and rhythm without murmur auscultated.  Good S1/S2. Abd:  Soft/nondistended/nontender.  GU:  Uncircumcised male. Left testicle within normal limits. Right testicle with evidence of hernia within the inguinal canal.  This is moderately tender but reducible easily. Scrotum itself is normal-appearing without redness or swelling. Ext:  No clubbing/cyanosis/erythema.  No edema noted bilateral lower extremities.   Neuro:  Grossly normal, no gait abnormalities.  Insensate at tip of right great toe. He has some mild numbness along the plantar aspect of his right toe. However all the rest of his toes and the rest of his foot have full 5 out of 5 sensation.  Full sensation dorsum of Right great toe.  Psych:  Not depressed or anxious appearing.  Conversant and engaged  Impression/Plan: 1. Complete  Physical Examination: anticipatory guidance provided.  2.  STD screening/high risk sexual behavior: counseling provided; obtain GC/Chlam/RPR/HIV. 3.  Right inguinal hernia: Plan refer to surgery. Discussed various options with patient. He like to further discuss his options with surgery.  Reducible and no signs of incarceration/strangulation. 4. Right toe numbness: Most likely secondary to repetitive trauma from running and working out. No evidence of Morton's neuroma on exam. He has no numbness otherwise simply starting at the base of his right toe. Does not affect the dorsal aspect of his toe. We are checking for diabetes today that I think this is going to be negative. If worsens or spreads to the dorsal aspect of his toe he was counseled to return.

## 2016-10-16 LAB — CBC
Hematocrit: 44 % (ref 37.5–51.0)
Hemoglobin: 14.8 g/dL (ref 13.0–17.7)
MCH: 30.3 pg (ref 26.6–33.0)
MCHC: 33.6 g/dL (ref 31.5–35.7)
MCV: 90 fL (ref 79–97)
PLATELETS: 273 10*3/uL (ref 150–379)
RBC: 4.88 x10E6/uL (ref 4.14–5.80)
RDW: 13.3 % (ref 12.3–15.4)
WBC: 5.4 10*3/uL (ref 3.4–10.8)

## 2016-10-16 LAB — COMPREHENSIVE METABOLIC PANEL
A/G RATIO: 2.1 (ref 1.2–2.2)
ALBUMIN: 4.8 g/dL (ref 3.5–5.5)
ALT: 30 IU/L (ref 0–44)
AST: 31 IU/L (ref 0–40)
Alkaline Phosphatase: 54 IU/L (ref 39–117)
BUN/Creatinine Ratio: 26 — ABNORMAL HIGH (ref 9–20)
BUN: 24 mg/dL — ABNORMAL HIGH (ref 6–20)
Bilirubin Total: 0.4 mg/dL (ref 0.0–1.2)
CALCIUM: 9.6 mg/dL (ref 8.7–10.2)
CO2: 26 mmol/L (ref 18–29)
CREATININE: 0.94 mg/dL (ref 0.76–1.27)
Chloride: 100 mmol/L (ref 96–106)
GFR calc Af Amer: 128 mL/min/{1.73_m2} (ref >59)
GFR, EST NON AFRICAN AMERICAN: 111 mL/min/{1.73_m2} (ref >59)
GLOBULIN, TOTAL: 2.3 g/dL (ref 1.5–4.5)
Glucose: 57 mg/dL — ABNORMAL LOW (ref 65–99)
POTASSIUM: 4.4 mmol/L (ref 3.5–5.2)
SODIUM: 141 mmol/L (ref 134–144)
TOTAL PROTEIN: 7.1 g/dL (ref 6.0–8.5)

## 2016-10-16 LAB — RPR: RPR Ser Ql: NONREACTIVE

## 2016-10-16 LAB — HIV ANTIBODY (ROUTINE TESTING W REFLEX): HIV SCREEN 4TH GENERATION: NONREACTIVE

## 2016-10-17 ENCOUNTER — Encounter: Payer: Self-pay | Admitting: Family Medicine

## 2016-10-17 DIAGNOSIS — K4090 Unilateral inguinal hernia, without obstruction or gangrene, not specified as recurrent: Secondary | ICD-10-CM

## 2016-10-23 DIAGNOSIS — K4090 Unilateral inguinal hernia, without obstruction or gangrene, not specified as recurrent: Secondary | ICD-10-CM

## 2016-10-23 HISTORY — DX: Unilateral inguinal hernia, without obstruction or gangrene, not specified as recurrent: K40.90

## 2016-10-24 ENCOUNTER — Ambulatory Visit: Payer: Self-pay | Admitting: Surgery

## 2016-10-30 ENCOUNTER — Encounter (HOSPITAL_BASED_OUTPATIENT_CLINIC_OR_DEPARTMENT_OTHER): Payer: Self-pay | Admitting: *Deleted

## 2016-10-30 NOTE — Pre-Procedure Instructions (Signed)
To come pick up Boost Breeze 8 oz. to drink by 1000 DOS 

## 2016-11-03 NOTE — Progress Notes (Signed)
Boost given with instructions to complete by 1000, pt verbalized understanding.

## 2016-11-04 ENCOUNTER — Ambulatory Visit (HOSPITAL_BASED_OUTPATIENT_CLINIC_OR_DEPARTMENT_OTHER): Payer: Managed Care, Other (non HMO) | Admitting: Anesthesiology

## 2016-11-04 ENCOUNTER — Encounter (HOSPITAL_BASED_OUTPATIENT_CLINIC_OR_DEPARTMENT_OTHER)
Admission: RE | Disposition: A | Discharge status: Home / Self Care | Payer: Self-pay | Source: Ambulatory Visit | Attending: Surgery

## 2016-11-04 ENCOUNTER — Encounter (HOSPITAL_BASED_OUTPATIENT_CLINIC_OR_DEPARTMENT_OTHER): Payer: Self-pay

## 2016-11-04 ENCOUNTER — Ambulatory Visit (HOSPITAL_BASED_OUTPATIENT_CLINIC_OR_DEPARTMENT_OTHER)
Admission: RE | Admit: 2016-11-04 | Discharge: 2016-11-04 | Disposition: A | Discharge status: Home / Self Care | Payer: Managed Care, Other (non HMO) | Source: Ambulatory Visit | Attending: Surgery | Admitting: Surgery

## 2016-11-04 DIAGNOSIS — Z79899 Other long term (current) drug therapy: Secondary | ICD-10-CM | POA: Diagnosis not present

## 2016-11-04 DIAGNOSIS — F329 Major depressive disorder, single episode, unspecified: Secondary | ICD-10-CM | POA: Diagnosis not present

## 2016-11-04 DIAGNOSIS — K4090 Unilateral inguinal hernia, without obstruction or gangrene, not specified as recurrent: Secondary | ICD-10-CM | POA: Insufficient documentation

## 2016-11-04 HISTORY — PX: INGUINAL HERNIA REPAIR: SHX194

## 2016-11-04 HISTORY — DX: Other symptoms and signs involving the musculoskeletal system: R29.898

## 2016-11-04 HISTORY — DX: Unilateral inguinal hernia, without obstruction or gangrene, not specified as recurrent: K40.90

## 2016-11-04 HISTORY — PX: INSERTION OF MESH: SHX5868

## 2016-11-04 SURGERY — REPAIR, HERNIA, INGUINAL, ADULT
Anesthesia: General | Site: Groin | Laterality: Right

## 2016-11-04 MED ORDER — HYDROMORPHONE HCL 1 MG/ML IJ SOLN
INTRAMUSCULAR | Status: AC
  Filled 2016-11-04: qty 1

## 2016-11-04 MED ORDER — BUPIVACAINE-EPINEPHRINE (PF) 0.5% -1:200000 IJ SOLN
INTRAMUSCULAR | Status: DC | PRN
  Administered 2016-11-04: 30 mL

## 2016-11-04 MED ORDER — OXYCODONE HCL 5 MG PO TABS: 5.0000 mg | ORAL_TABLET | Freq: Four times a day (QID) | ORAL | 0 refills | Status: DC | PRN

## 2016-11-04 MED ORDER — ACETAMINOPHEN 500 MG PO TABS
1000.0000 mg | ORAL_TABLET | ORAL | Status: AC
  Administered 2016-11-04: 1000 mg via ORAL

## 2016-11-04 MED ORDER — FENTANYL CITRATE (PF) 100 MCG/2ML IJ SOLN
INTRAMUSCULAR | Status: AC
  Filled 2016-11-04: qty 2

## 2016-11-04 MED ORDER — MEPERIDINE HCL 25 MG/ML IJ SOLN: 6.2500 mg | INTRAMUSCULAR | Status: DC | PRN

## 2016-11-04 MED ORDER — LIDOCAINE 2% (20 MG/ML) 5 ML SYRINGE
INTRAMUSCULAR | Status: AC
  Filled 2016-11-04: qty 5

## 2016-11-04 MED ORDER — BUPIVACAINE-EPINEPHRINE 0.5% -1:200000 IJ SOLN
INTRAMUSCULAR | Status: DC | PRN
  Administered 2016-11-04: 5 mL

## 2016-11-04 MED ORDER — DEXTROSE 5 % IV SOLN
3.0000 g | INTRAVENOUS | Status: AC
  Administered 2016-11-04: 2 g via INTRAVENOUS

## 2016-11-04 MED ORDER — PROPOFOL 10 MG/ML IV BOLUS
INTRAVENOUS | Status: DC | PRN
  Administered 2016-11-04: 200 mg via INTRAVENOUS

## 2016-11-04 MED ORDER — SCOPOLAMINE 1 MG/3DAYS TD PT72: 1.0000 | MEDICATED_PATCH | Freq: Once | TRANSDERMAL | Status: DC | PRN

## 2016-11-04 MED ORDER — LACTATED RINGERS IV SOLN
INTRAVENOUS | Status: DC
  Administered 2016-11-04 (×2): via INTRAVENOUS

## 2016-11-04 MED ORDER — MIDAZOLAM HCL 2 MG/2ML IJ SOLN
1.0000 mg | INTRAMUSCULAR | Status: DC | PRN
  Administered 2016-11-04 (×2): 2 mg via INTRAVENOUS

## 2016-11-04 MED ORDER — SUGAMMADEX SODIUM 200 MG/2ML IV SOLN
INTRAVENOUS | Status: DC | PRN
  Administered 2016-11-04: 200 mg via INTRAVENOUS

## 2016-11-04 MED ORDER — CELECOXIB 400 MG PO CAPS
400.0000 mg | ORAL_CAPSULE | ORAL | Status: AC
  Administered 2016-11-04: 400 mg via ORAL

## 2016-11-04 MED ORDER — PROMETHAZINE HCL 25 MG/ML IJ SOLN: 6.2500 mg | INTRAMUSCULAR | Status: DC | PRN

## 2016-11-04 MED ORDER — CHLORHEXIDINE GLUCONATE CLOTH 2 % EX PADS: 6.0000 | MEDICATED_PAD | Freq: Once | CUTANEOUS | Status: DC

## 2016-11-04 MED ORDER — HYDROMORPHONE HCL 1 MG/ML IJ SOLN
0.2500 mg | INTRAMUSCULAR | Status: DC | PRN
  Administered 2016-11-04 (×2): 0.5 mg via INTRAVENOUS

## 2016-11-04 MED ORDER — GABAPENTIN 300 MG PO CAPS
300.0000 mg | ORAL_CAPSULE | ORAL | Status: AC
  Administered 2016-11-04: 300 mg via ORAL

## 2016-11-04 MED ORDER — HYDROCODONE-ACETAMINOPHEN 7.5-325 MG PO TABS: 1.0000 | ORAL_TABLET | Freq: Once | ORAL | Status: DC | PRN

## 2016-11-04 MED ORDER — FENTANYL CITRATE (PF) 100 MCG/2ML IJ SOLN
50.0000 ug | INTRAMUSCULAR | Status: AC | PRN
  Administered 2016-11-04: 50 ug via INTRAVENOUS
  Administered 2016-11-04: 100 ug via INTRAVENOUS
  Administered 2016-11-04: 50 ug via INTRAVENOUS

## 2016-11-04 MED ORDER — LIDOCAINE 2% (20 MG/ML) 5 ML SYRINGE
INTRAMUSCULAR | Status: DC | PRN
  Administered 2016-11-04: 100 mg via INTRAVENOUS

## 2016-11-04 MED ORDER — GABAPENTIN 300 MG PO CAPS
ORAL_CAPSULE | ORAL | Status: AC
  Filled 2016-11-04: qty 1

## 2016-11-04 MED ORDER — ACETAMINOPHEN 500 MG PO TABS
ORAL_TABLET | ORAL | Status: AC
  Filled 2016-11-04: qty 2

## 2016-11-04 MED ORDER — ONDANSETRON HCL 4 MG/2ML IJ SOLN
INTRAMUSCULAR | Status: AC
  Filled 2016-11-04: qty 2

## 2016-11-04 MED ORDER — CELECOXIB 200 MG PO CAPS
ORAL_CAPSULE | ORAL | Status: AC
  Filled 2016-11-04: qty 2

## 2016-11-04 MED ORDER — DEXAMETHASONE SODIUM PHOSPHATE 4 MG/ML IJ SOLN
INTRAMUSCULAR | Status: DC | PRN
  Administered 2016-11-04: 10 mg via INTRAVENOUS

## 2016-11-04 MED ORDER — DEXAMETHASONE SODIUM PHOSPHATE 10 MG/ML IJ SOLN
INTRAMUSCULAR | Status: AC
  Filled 2016-11-04: qty 1

## 2016-11-04 MED ORDER — CELECOXIB 100 MG PO CAPS: 100.0000 mg | ORAL_CAPSULE | Freq: Two times a day (BID) | ORAL | 1 refills | Status: DC

## 2016-11-04 MED ORDER — MIDAZOLAM HCL 2 MG/2ML IJ SOLN
INTRAMUSCULAR | Status: AC
  Filled 2016-11-04: qty 2

## 2016-11-04 MED ORDER — ROCURONIUM BROMIDE 50 MG/5ML IV SOSY
PREFILLED_SYRINGE | INTRAVENOUS | Status: AC
  Filled 2016-11-04: qty 5

## 2016-11-04 MED ORDER — ROCURONIUM BROMIDE 10 MG/ML (PF) SYRINGE
PREFILLED_SYRINGE | INTRAVENOUS | Status: DC | PRN
  Administered 2016-11-04: 40 mg via INTRAVENOUS

## 2016-11-04 MED ORDER — CEFAZOLIN SODIUM-DEXTROSE 2-4 GM/100ML-% IV SOLN
INTRAVENOUS | Status: AC
  Filled 2016-11-04: qty 100

## 2016-11-04 MED ORDER — ONDANSETRON HCL 4 MG/2ML IJ SOLN
INTRAMUSCULAR | Status: DC | PRN
  Administered 2016-11-04: 4 mg via INTRAVENOUS

## 2016-11-04 SURGICAL SUPPLY — 51 items
BLADE CLIPPER SURG (BLADE) IMPLANT
BLADE SURG 15 STRL LF DISP TIS (BLADE) ×1 IMPLANT
BLADE SURG 15 STRL SS (BLADE) ×2
CANISTER SUCT 1200ML W/VALVE (MISCELLANEOUS) ×3 IMPLANT
CHLORAPREP W/TINT 26ML (MISCELLANEOUS) ×3 IMPLANT
COVER BACK TABLE 60X90IN (DRAPES) ×3 IMPLANT
COVER MAYO STAND STRL (DRAPES) ×3 IMPLANT
DECANTER SPIKE VIAL GLASS SM (MISCELLANEOUS) IMPLANT
DERMABOND ADVANCED (GAUZE/BANDAGES/DRESSINGS) ×2
DERMABOND ADVANCED .7 DNX12 (GAUZE/BANDAGES/DRESSINGS) ×1 IMPLANT
DRAIN PENROSE 1/2X12 LTX STRL (WOUND CARE) ×3 IMPLANT
DRAPE LAPAROTOMY TRNSV 102X78 (DRAPE) ×3 IMPLANT
DRAPE UTILITY XL STRL (DRAPES) ×3 IMPLANT
ELECT COATED BLADE 2.86 ST (ELECTRODE) ×3 IMPLANT
ELECT REM PT RETURN 9FT ADLT (ELECTROSURGICAL) ×3
ELECTRODE REM PT RTRN 9FT ADLT (ELECTROSURGICAL) ×1 IMPLANT
GAUZE SPONGE 4X4 16PLY XRAY LF (GAUZE/BANDAGES/DRESSINGS) IMPLANT
GLOVE BIOGEL PI IND STRL 7.5 (GLOVE) ×1 IMPLANT
GLOVE BIOGEL PI IND STRL 8 (GLOVE) ×1 IMPLANT
GLOVE BIOGEL PI INDICATOR 7.5 (GLOVE) ×2
GLOVE BIOGEL PI INDICATOR 8 (GLOVE) ×2
GLOVE ECLIPSE 8.0 STRL XLNG CF (GLOVE) ×3 IMPLANT
GLOVE EXAM NITRILE EXT CUFF MD (GLOVE) ×3 IMPLANT
GLOVE SURG SYN 7.5  E (GLOVE) ×2
GLOVE SURG SYN 7.5 E (GLOVE) ×1 IMPLANT
GOWN STRL REUS W/ TWL LRG LVL3 (GOWN DISPOSABLE) ×2 IMPLANT
GOWN STRL REUS W/TWL LRG LVL3 (GOWN DISPOSABLE) ×4
MESH PARIETEX PROGRIP RIGHT (Mesh General) ×3 IMPLANT
NEEDLE HYPO 25X1 1.5 SAFETY (NEEDLE) ×3 IMPLANT
NS IRRIG 1000ML POUR BTL (IV SOLUTION) ×3 IMPLANT
PACK BASIN DAY SURGERY FS (CUSTOM PROCEDURE TRAY) ×3 IMPLANT
PENCIL BUTTON HOLSTER BLD 10FT (ELECTRODE) ×3 IMPLANT
SLEEVE SCD COMPRESS KNEE MED (MISCELLANEOUS) ×3 IMPLANT
SPONGE GAUZE 4X4 12PLY STER LF (GAUZE/BANDAGES/DRESSINGS) IMPLANT
SPONGE LAP 4X18 X RAY DECT (DISPOSABLE) ×3 IMPLANT
SUT MON AB 4-0 PC3 18 (SUTURE) ×3 IMPLANT
SUT NOVA 0 T19/GS 22DT (SUTURE) ×6 IMPLANT
SUT VIC AB 0 SH 27 (SUTURE) ×3 IMPLANT
SUT VIC AB 2-0 SH 27 (SUTURE) ×2
SUT VIC AB 2-0 SH 27XBRD (SUTURE) ×1 IMPLANT
SUT VIC AB 3-0 54X BRD REEL (SUTURE) IMPLANT
SUT VIC AB 3-0 BRD 54 (SUTURE)
SUT VICRYL 3-0 CR8 SH (SUTURE) ×3 IMPLANT
SUT VICRYL AB 2 0 TIE (SUTURE) IMPLANT
SUT VICRYL AB 2 0 TIES (SUTURE)
SYR CONTROL 10ML LL (SYRINGE) ×3 IMPLANT
TOWEL OR 17X24 6PK STRL BLUE (TOWEL DISPOSABLE) ×3 IMPLANT
TOWEL OR NON WOVEN STRL DISP B (DISPOSABLE) IMPLANT
TUBE CONNECTING 20'X1/4 (TUBING) ×1
TUBE CONNECTING 20X1/4 (TUBING) ×2 IMPLANT
YANKAUER SUCT BULB TIP NO VENT (SUCTIONS) ×3 IMPLANT

## 2016-11-04 NOTE — Op Note (Signed)
Preoperative diagnosis: Right inguinal hernia  Postoperative diagnosis: Same  Procedure: Repair of right inguinal hernia with Pro grip mesh  Surgeon: Erroll Luna M.D.  Anesthesia: Gen. with regional block per anesthesia and local anesthetic  EBL: Minimal  Specimens: None  Indications for procedure: Patient presents for repair of a symptomatic right inguinal hernia. The patient said pain, discomfort and a bulge for a couple of months. Examination revealed a right inguinal hernia. Risk, benefits and alternatives to surgery were discussed with the patient.The risk of hernia repair include bleeding,  Infection,   Recurrence of the hernia,  Mesh use, chronic pain,  Organ injury,  Bowel injury,  Bladder injury,   nerve injury with numbness around the incision,  Death,  and worsening of preexisting  medical problems.  The alternatives to surgery have been discussed as well..  Long term expectations of both operative and non operative treatments have been discussed.   The patient agrees to proceed.  Description of procedure: The patient was met in the holding area. The right side was marked as the correct side. The patient underwent a regional block by anesthesia. All questions were answered need green to proceed with repair. He was taken back to the operating room and placed supine on the OR table. After induction of general anesthesia, the right inguinal region was prepped and draped in a sterile fashion. Timeout was done. He received preoperative antibiotics. Local anesthetic was infiltrated in the right inguinal crease. An oblique incision was made and dissection was carried down through the subcutaneous tissues until the aponeurosis of the external oblique was identified. The fibers were opened in the direction of the fibers through the external ring. Cord structures were encircled with a quarter-inch Penrose drain to include the ileo-inguinal nerve. The patient had a direct hernia defect. Pro grip  mesh was used and placed on the floor the inguinal canal. It was secured to the pubic tubercle with #1 Novafil suture. Interrupted #1 Novafil suture was used to secure the mesh to the internal oblique, the conjoined tendon, and the shelving edge of the inguinal ligament. The mesh was wrapped around the cord and there is ample room for cord structures to exit without impingement. There is no evidence of indirect hernia on examination. Aponeurosis of the external oblique was approximated with 2-0 Vicryl. 3-0 Vicryl was used to close the subcutaneous tissues and 4-0 Monocryl used to close the skin in a subcuticular fashion. All final counts are found to be correct. The patient was awoke extubated taken to recovery in satisfactory condition.

## 2016-11-04 NOTE — Interval H&P Note (Signed)
History and Physical Interval Note:  11/04/2016 1:35 PM  Jared Alvarado  has presented today for surgery, with the diagnosis of RIGHT INGUINAL HERNIA  The various methods of treatment have been discussed with the patient and family. After consideration of risks, benefits and other options for treatment, the patient has consented to  Procedure(s): RIGHT INGUINAL HERNIA REPAIR (Right) INSERTION OF MESH (Right) as a surgical intervention .  The patient's history has been reviewed, patient examined, no change in status, stable for surgery.  I have reviewed the patient's chart and labs.  Questions were answered to the patient's satisfaction.     Barrett Holthaus A.

## 2016-11-04 NOTE — Anesthesia Postprocedure Evaluation (Signed)
Anesthesia Post Note  Patient: Jared Alvarado  Procedure(s) Performed: Procedure(s) (LRB): RIGHT INGUINAL HERNIA REPAIR (Right) INSERTION OF MESH (Right)  Patient location during evaluation: PACU Anesthesia Type: General Level of consciousness: awake and alert and oriented Pain management: pain level controlled Vital Signs Assessment: post-procedure vital signs reviewed and stable Respiratory status: spontaneous breathing, nonlabored ventilation and respiratory function stable Cardiovascular status: blood pressure returned to baseline and stable Postop Assessment: no signs of nausea or vomiting Anesthetic complications: no       Last Vitals:  Vitals:   11/04/16 1457 11/04/16 1458  BP: (!) 142/85   Pulse: 81 84  Resp: 14 11  Temp: 36.9 C     Last Pain:  Vitals:   11/04/16 1515  TempSrc:   PainSc: 4                  Bitania Shankland A.

## 2016-11-04 NOTE — Anesthesia Procedure Notes (Signed)
Procedure Name: Intubation Date/Time: 11/04/2016 2:06 PM Performed by: Lyndee Leo Pre-anesthesia Checklist: Patient identified, Emergency Drugs available, Suction available and Patient being monitored Patient Re-evaluated:Patient Re-evaluated prior to inductionOxygen Delivery Method: Circle system utilized Preoxygenation: Pre-oxygenation with 100% oxygen Intubation Type: IV induction Ventilation: Mask ventilation without difficulty Laryngoscope Size: Mac and 3 Tube type: Oral Tube size: 8.0 mm Number of attempts: 1 Airway Equipment and Method: Stylet and Oral airway Placement Confirmation: ETT inserted through vocal cords under direct vision,  positive ETCO2 and breath sounds checked- equal and bilateral Secured at: 21 cm Tube secured with: Tape Dental Injury: Teeth and Oropharynx as per pre-operative assessment

## 2016-11-04 NOTE — H&P (Signed)
Jared Alvarado is an 26 y.o. male.   Chief Complaint: right inguinal hernia  HPI: pt presents for repair of a painful RIH.  He has had it for several months.    Past Medical History:  Diagnosis Date  . Anxiety    states he is "emotionally frail"  . Depression   . Inguinal hernia 10/2016   right  . Jaw clicking   . Pectus excavatum     Past Surgical History:  Procedure Laterality Date  . PECTUS BAR REMOVAL  2010  . PECTUS EXCAVATUM REPAIR  2007   Nuss procedure  . TALC PLEURODESIS  2008    History reviewed. No pertinent family history. Social History:  reports that he has never smoked. He has never used smokeless tobacco. He reports that he drinks alcohol. He reports that he uses drugs, including Marijuana.  Allergies:  Allergies  Allergen Reactions  . Peanut-Containing Drug Products Rash  . Shellfish Allergy Rash    Medications Prior to Admission  Medication Sig Dispense Refill  . Multiple Vitamin (MULTIVITAMIN) tablet Take 1 tablet by mouth daily.      No results found for this or any previous visit (from the past 48 hour(s)). No results found.  Review of Systems  Constitutional: Negative.   HENT: Negative.   Eyes: Negative.   Respiratory: Negative.   Cardiovascular: Negative.   Gastrointestinal: Negative.   Genitourinary: Negative.   Musculoskeletal: Negative.   Skin: Negative.     Blood pressure 116/63, pulse 64, temperature 98.2 F (36.8 C), temperature source Oral, resp. rate 12, height 6\' 2"  (1.88 m), weight 82.6 kg (182 lb), SpO2 100 %. Physical Exam  Constitutional: He appears well-developed and well-nourished.  HENT:  Head: Normocephalic.  Eyes: EOM are normal. Pupils are equal, round, and reactive to light.  Neck: Normal range of motion. Neck supple.  Cardiovascular: Normal rate.   Respiratory: Effort normal and breath sounds normal.  GI: A hernia is present. Hernia confirmed positive in the right inguinal area.     Assessment/Plan RIGHT  INGUINAL HERNIA    The risk of hernia repair include bleeding,  Infection,   Recurrence of the hernia,  Mesh use, chronic pain,  Organ injury,  Bowel injury,  Bladder injury,   nerve injury with numbness around the incision,  Death,  and worsening of preexisting  medical problems.  The alternatives to surgery have been discussed as well..  Long term expectations of both operative and non operative treatments have been discussed.   The patient agrees to proceed.  Forbes Loll A., MD 11/04/2016, 1:32 PM

## 2016-11-04 NOTE — Progress Notes (Signed)
Assisted Dr. Foster with right, ultrasound guided, transabdominal plane block. Side rails up, monitors on throughout procedure. See vital signs in flow sheet. Tolerated Procedure well. 

## 2016-11-04 NOTE — Transfer of Care (Signed)
Immediate Anesthesia Transfer of Care Note  Patient: Jared Alvarado  Procedure(s) Performed: Procedure(s): RIGHT INGUINAL HERNIA REPAIR (Right) INSERTION OF MESH (Right)  Patient Location: PACU  Anesthesia Type:GA combined with regional for post-op pain  Level of Consciousness: awake, sedated and patient cooperative  Airway & Oxygen Therapy: Patient Spontanous Breathing and Patient connected to nasal cannula oxygen  Post-op Assessment: Report given to RN and Post -op Vital signs reviewed and stable  Post vital signs: Reviewed and stable  Last Vitals:  Vitals:   11/04/16 1305 11/04/16 1309  BP: 107/63 116/63  Pulse: 71 64  Resp: 13 12  Temp:      Last Pain:  Vitals:   11/04/16 1217  TempSrc: Oral  PainSc: 4       Patients Stated Pain Goal: 2 (11/04/16 1217)  Complications: No apparent anesthesia complications

## 2016-11-04 NOTE — Anesthesia Preprocedure Evaluation (Signed)
Anesthesia Evaluation  Patient identified by MRN, date of birth, ID band Patient awake    Reviewed: Allergy & Precautions, NPO status , Patient's Chart, lab work & pertinent test results  Airway Mallampati: II  TM Distance: >3 FB Neck ROM: Full    Dental no notable dental hx. (+) Teeth Intact, Caps   Pulmonary neg pulmonary ROS,   Pectus Excavatum deformity of sternum Pulmonary exam normal breath sounds clear to auscultation       Cardiovascular negative cardio ROS Normal cardiovascular exam Rhythm:Regular Rate:Normal     Neuro/Psych PSYCHIATRIC DISORDERS Anxiety Depression negative neurological ROS     GI/Hepatic negative GI ROS, Neg liver ROS,   Endo/Other  negative endocrine ROS  Renal/GU negative Renal ROS  negative genitourinary   Musculoskeletal Right inguinal hernia without obstruction Hx/o Pectus excavatum   Abdominal   Peds  Hematology negative hematology ROS (+)   Anesthesia Other Findings   Reproductive/Obstetrics                             Anesthesia Physical Anesthesia Plan  ASA: II  Anesthesia Plan: General and Regional   Post-op Pain Management:  Regional for Post-op pain   Induction: Intravenous  Airway Management Planned: LMA  Additional Equipment:   Intra-op Plan:   Post-operative Plan: Extubation in OR  Informed Consent: I have reviewed the patients History and Physical, chart, labs and discussed the procedure including the risks, benefits and alternatives for the proposed anesthesia with the patient or authorized representative who has indicated his/her understanding and acceptance.   Dental advisory given  Plan Discussed with: CRNA, Anesthesiologist and Surgeon  Anesthesia Plan Comments:         Anesthesia Quick Evaluation

## 2016-11-04 NOTE — Discharge Instructions (Signed)
CC[facility] _______[facility] ° °UMBILICAL OR INGUINAL HERNIA REPAIR: POST OP INSTRUCTIONS ° °Always review your discharge instruction sheet given to you by the facility where your surgery was performed. °IF YOU HAVE DISABILITY OR FAMILY LEAVE FORMS, YOU MUST BRING THEM TO THE OFFICE FOR PROCESSING.   °DO NOT GIVE THEM TO YOUR DOCTOR. ° °1. A  prescription for pain medication may be given to you upon discharge.  Take your pain medication as prescribed, if needed.  If narcotic pain medicine is not needed, then you may take acetaminophen (Tylenol) or ibuprofen (Advil) as needed. °2. Take your usually prescribed medications unless otherwise directed. °If you need a refill on your pain medication, please contact your pharmacy.  They will contact our office to request authorization. Prescriptions will not be filled after 5 pm or on week-ends. °3. You should follow a light diet the first 24 hours after arrival home, such as soup and crackers, etc.  Be sure to include lots of fluids daily.  Resume your normal diet the day after surgery. °4.Most patients will experience some swelling and bruising around the umbilicus or in the groin and scrotum.  Ice packs and reclining will help.  Swelling and bruising can take several days to resolve.  °6. It is common to experience some constipation if taking pain medication after surgery.  Increasing fluid intake and taking a stool softener (such as Colace) will usually help or prevent this problem from occurring.  A mild laxative (Milk of Magnesia or Miralax) should be taken according to package directions if there are no bowel movements after 48 hours. °7. Unless discharge instructions indicate otherwise, you may remove your bandages 24-48 hours after surgery, and you may shower at that time.  You may have steri-strips (small skin tapes) in place directly over the incision.  These strips should be left on the skin for 7-10 days.  If your surgeon used skin glue on the  incision, you may shower in 24 hours.  The glue will flake off over the next 2-3 weeks.  Any sutures or staples will be removed at the office during your follow-up visit. °8. ACTIVITIES:  You may resume regular (light) daily activities beginning the next day--such as daily self-care, walking, climbing stairs--gradually increasing activities as tolerated.  You may have sexual intercourse when it is comfortable.  Refrain from any heavy lifting or straining until approved by your doctor. ° °a.You may drive when you are no longer taking prescription pain medication, you can comfortably wear a seatbelt, and you can safely maneuver your car and apply brakes. °b.RETURN TO WORK:   °_____________________________________________ ° °9.You should see your doctor in the office for a follow-up appointment approximately 2-3 weeks after your surgery.  Make sure that you call for this appointment within a day or two after you arrive home to insure a convenient appointment time. °10.OTHER INSTRUCTIONS: _________________________ °   _____________________________________ ° °WHEN TO CALL YOUR DOCTOR: °1. Fever over 101.0 °2. Inability to urinate °3. Nausea and/or vomiting °4. Extreme swelling or bruising °5. Continued bleeding from incision. °6. Increased pain, redness, or drainage from the incision ° °The clinic staff is available to answer your questions during regular business hours.  Please don’t hesitate to call and ask to speak to one of the nurses for clinical concerns.  If you have a medical emergency, go to the nearest emergency room or call 911.  A surgeon from [facility] is always on call at the hospital ° ° °  [address], [address], [place], [place]  [address] ? ° [address] [address], [place], [place]   [address] °[phone] ? [phone] ? FAX [phone] °Web site: [email or web address] ° ° ° ° °Post Anesthesia Home Care Instructions ° °Activity: °Get plenty of rest for the remainder of the day.  A responsible adult should stay with you for 24 hours following the procedure.  °For the next 24 hours, DO NOT: °-Drive a car °-Operate machinery °-Drink alcoholic beverages °-Take any medication unless instructed by your physician °-Make any legal decisions or sign important papers. ° °Meals: °Start with liquid foods such as gelatin or soup. Progress to regular foods as tolerated. Avoid greasy, spicy, heavy foods. If nausea and/or vomiting occur, drink only clear liquids until the nausea and/or vomiting subsides. Call your physician if vomiting continues. ° °Special Instructions/Symptoms: °Your throat may feel dry or sore from the anesthesia or the breathing tube placed in your throat during surgery. If this causes discomfort, gargle with warm salt water. The discomfort should disappear within 24 hours. ° °If you had a scopolamine patch placed behind your ear for the management of post- operative nausea and/or vomiting: ° °1. The medication in the patch is effective for 72 hours, after which it should be removed.  Wrap patch in a tissue and discard in the trash. Wash hands thoroughly with soap and water. °2. You may remove the patch earlier than 72 hours if you experience unpleasant side effects which may include dry mouth, dizziness or visual disturbances. °3. Avoid touching the patch. Wash your hands with soap and water after contact with the patch. °  ° ° ° °Regional Anesthesia Blocks ° °1. Numbness or the inability to move the "blocked" extremity may last from 3-48 hours after placement. The length of time depends on the medication injected and your individual response to the medication. If the numbness is not going away after 48 hours, call your surgeon. ° °2. The extremity that is blocked will need to be protected until the numbness is gone and the  Strength has returned. Because you cannot feel it, you will need to take extra care to avoid injury. Because it may be weak, you may have difficulty moving it or  using it. You may not know what position it is in without looking at it while the block is in effect. ° °3. For blocks in the legs and feet, returning to weight bearing and walking needs to be done carefully. You will need to wait until the numbness is entirely gone and the strength has returned. You should be able to move your leg and foot normally before you try and bear weight or walk. You will need someone to be with you when you first try to ensure you do not fall and possibly risk injury. ° °4. Bruising and tenderness at the needle site are common side effects and will resolve in a few days. ° °5. Persistent numbness or new problems with movement should be communicated to the surgeon or the [facility] ([phone])/ [facility] ([phone]). °

## 2016-11-04 NOTE — Anesthesia Procedure Notes (Addendum)
Anesthesia Regional Block:  TAP block  Pre-Anesthetic Checklist: ,, timeout performed, Correct Patient, Correct Site, Correct Laterality, Correct Procedure, Correct Position, site marked, Risks and benefits discussed,  Surgical consent,  Pre-op evaluation,  At surgeon's request and post-op pain management  Laterality: Right  Prep: chloraprep       Needles:  Injection technique: Single-shot  Needle Type: Echogenic Stimulator Needle     Needle Length: 9cm 9 cm Needle Gauge: 21 and 21 G  Needle insertion depth: 5 cm   Additional Needles:  Procedures: ultrasound guided (picture in chart) TAP block Narrative:  Start time: 11/04/2016 1:08 PM End time: 11/04/2016 1:13 PM Injection made incrementally with aspirations every 5 mL.  Performed by: Personally  Anesthesiologist: Mal AmabileFOSTER, Breslyn Abdo  Additional Notes: Timeout performed. Patient sedated. Relevant anatomy ID'd using US. Incremental 5ml injection with frequent aspiration. Patient tolerated procedure well.

## 2016-11-05 ENCOUNTER — Encounter (HOSPITAL_BASED_OUTPATIENT_CLINIC_OR_DEPARTMENT_OTHER): Payer: Self-pay | Admitting: Surgery

## 2017-01-01 ENCOUNTER — Ambulatory Visit (INDEPENDENT_AMBULATORY_CARE_PROVIDER_SITE_OTHER): Payer: Managed Care, Other (non HMO) | Admitting: Family Medicine

## 2017-01-01 ENCOUNTER — Encounter: Payer: Self-pay | Admitting: Family Medicine

## 2017-01-01 ENCOUNTER — Telehealth: Payer: Self-pay | Admitting: Family Medicine

## 2017-01-01 VITALS — BP 139/76 | HR 92 | Temp 98.1°F | Resp 16 | Ht 74.0 in | Wt 185.2 lb

## 2017-01-01 DIAGNOSIS — N50811 Right testicular pain: Secondary | ICD-10-CM | POA: Diagnosis not present

## 2017-01-01 LAB — POCT URINALYSIS DIP (MANUAL ENTRY)
BILIRUBIN UA: NEGATIVE mg/dL
Bilirubin, UA: NEGATIVE
Blood, UA: NEGATIVE
Glucose, UA: NEGATIVE mg/dL
LEUKOCYTES UA: NEGATIVE
Nitrite, UA: NEGATIVE
PH UA: 6 (ref 5.0–8.0)
PROTEIN UA: NEGATIVE mg/dL
UROBILINOGEN UA: 0.2 U/dL

## 2017-01-01 LAB — POC MICROSCOPIC URINALYSIS (UMFC): Mucus: ABSENT

## 2017-01-01 NOTE — Patient Instructions (Addendum)
  It was good to see you again today.   We are sending you for an ultrasound of your testicle.  I will call you with these results.   If this is completely negative, the next step is to go to see Urology for further input.    I'll let you know the culture results as well.   IF you received an x-ray today, you will receive an invoice from [facility]. Please contact [facility] at [phone] with questions or concerns regarding your invoice.   IF you received labwork today, you will receive an invoice from [place]. Please contact [facility] at [phone] with questions or concerns regarding your invoice.   Our billing staff will not be able to assist you with questions regarding bills from these companies.  You will be contacted with the lab results as soon as they are available. The fastest way to get your results is to activate your My Chart account. Instructions are located on the last page of this paperwork. If you have not heard from [place] regarding the results in 2 weeks, please contact this office.

## 2017-01-01 NOTE — Telephone Encounter (Signed)
Ordered added.

## 2017-01-01 NOTE — Addendum Note (Signed)
Addended by: Nonnie Done D on: 01/01/2017 04:13 PM   Modules accepted: Orders

## 2017-01-01 NOTE — Telephone Encounter (Signed)
Pt scheduled to have US Scrotum tomorrow at [facility] at 11:30 am. [facility] radiology said D[place]ler needs to be added to the order and signed off on by provider before they can do it.

## 2017-01-01 NOTE — Progress Notes (Signed)
Jared Alvarado is a 26 y.o. male who presents to Primary Care at [facility] today for FU from hernia surgery:  1.  Hernia surgery:  Patient with recent hernia surgery in Feb.  Diagnosed here at PCP, referred to general surgery, repaired.  States his pain from his surgery has completely resolved. Eating and drinking well. No nausea or vomiting. Normal bowel movements. He has not yet started working out again because he is not sure if he is able.  No fevers/chills.   2.  Right testicular pain:  This has persisted. States that he had similar testicular pain prior to the surgical repair of his hernia.  He describes this as constant dull aching pain and occasionally with sharp stabbing pain. States it was worse 2 days ago and thought it was gas. With much better yesterday. No sharp stabbing pain today but still does have the dull aching pain.  He states that sometimes it becomes warm and red. Other times it looks bruised. Otherwise has not noticed any swelling. No dysuria. No hesitancy urgency. No discharge.  ROS as above.   PMH reviewed. Patient is a nonsmoker.   Past Medical History:  Diagnosis Date  . Anxiety    states he is "emotionally frail"  . Depression   . Inguinal hernia 10/2016   right  . Jaw clicking   . Pectus excavatum    Past Surgical History:  Procedure Laterality Date  . INGUINAL HERNIA REPAIR Right 11/04/2016   Procedure: RIGHT INGUINAL HERNIA REPAIR;  Surgeon: Harriette Bouillon, MD;  Location: [facility];  Service: General;  Laterality: Right;  . INSERTION OF MESH Right 11/04/2016   Procedure: INSERTION OF MESH;  Surgeon: Harriette Bouillon, MD;  Location: [facility];  Service: General;  Laterality: Right;  . PECTUS BAR REMOVAL  2010  . PECTUS EXCAVATUM REPAIR  2007   Nuss procedure  . TALC PLEURODESIS  2008    Medications reviewed. Current Outpatient Prescriptions  Medication Sig Dispense Refill  . Multiple Vitamin (MULTIVITAMIN) tablet Take 1  tablet by mouth daily.    Marland Kitchen oxyCODONE (OXY IR/ROXICODONE) 5 MG immediate release tablet Take 1-2 tablets (5-10 mg total) by mouth every 6 (six) hours as needed for severe pain. (Patient not taking: Reported on 01/01/2017) 20 tablet 0   No current facility-administered medications for this visit.      Physical Exam:  BP 139/76   Pulse 92   Temp 98.1 F (36.7 C) (Oral)   Resp 16   Ht  (1.88 m)   Wt 185 lb 3.2 oz (84 kg)   SpO2 97%   BMI 23.78 kg/m  Gen:  Alert, cooperative patient who appears stated age in no acute distress.  Vital signs reviewed. HEENT: EOMI,  MMM Abd:  Soft/nondistended/nontender.  Good bowel sounds throughout all four quadrants.  No masses noted.   Skin:  Well healed surgical scar Right lower quandrant/inguinal area.  Nontender.  No redness or signs of infection.  GU:  Uncircumcised male.  Normal left testicle to palpation. No varicocele noted on right. Some very mild tenderness with palpation of right spermatic cord. Also very mild tenderness to palpation of right testicle. No redness or warmth. No swelling. No color change. Testicle is not high riding.  Assessment and Plan:  1.  Right testicular pain:  -obtained STD testing today. Urinalysis was within normal limits. -Checking scrotal ultrasound. -We'll call patient with results.  -if negative and still with pain plan to refer  to urology for further evaluation.  #2. Right hernia surgery:  -status post surgery. Patient is well healed. No further complaints except for #1 as above. -He can begin gradual return to working out as he's done in the past.

## 2017-01-02 ENCOUNTER — Ambulatory Visit (HOSPITAL_COMMUNITY)
Admission: RE | Admit: 2017-01-02 | Discharge: 2017-01-02 | Disposition: A | Discharge status: Home / Self Care | Payer: 59 | Source: Ambulatory Visit | Attending: Family Medicine | Admitting: Family Medicine

## 2017-01-02 ENCOUNTER — Telehealth: Payer: Self-pay | Admitting: Family Medicine

## 2017-01-02 DIAGNOSIS — N50811 Right testicular pain: Secondary | ICD-10-CM | POA: Insufficient documentation

## 2017-01-02 NOTE — Telephone Encounter (Signed)
Thanks

## 2017-01-02 NOTE — Telephone Encounter (Signed)
Pt calling about labs and U/S

## 2017-01-02 NOTE — Telephone Encounter (Signed)
See results and advise 

## 2017-01-03 LAB — GC/CHLAMYDIA PROBE AMP
Chlamydia trachomatis, NAA: NEGATIVE
Neisseria gonorrhoeae by PCR: NEGATIVE

## 2017-01-05 NOTE — Telephone Encounter (Signed)
l/m with dr Westly Pam note

## 2017-01-05 NOTE — Telephone Encounter (Signed)
The ultrasound and STD testing were both completely negative.  I still don't have a good explanation for his scrotal pain, when the hernia surgery should have resolved this.  I have put in a referral to urology, which I discussed with him we would do.  Please call and let him know.  Thanks!  JW

## 2017-01-14 ENCOUNTER — Telehealth: Payer: Self-pay | Admitting: Family Medicine

## 2017-01-14 DIAGNOSIS — N50811 Right testicular pain: Secondary | ICD-10-CM

## 2017-01-14 NOTE — Telephone Encounter (Signed)
[place] - Pt says he received some results but that no one has called him from here.  He wants Gwendolyn Grant to review it and call him.  I told him his referral was made to [facility] and gave him their number to hopefully speed up the process of getting him scheduled.  [phone]

## 2017-01-16 NOTE — Telephone Encounter (Signed)
PT CALLED TODAY AND WANTED DR Gwendolyn Grant TO LOOK AT HIS LABS AND TO REFER HIM TO ANOTHER UROLOGIST WITHIN [place] HE SAYS THAT HE DOESN'T LIKE [facility] AND HE CANT GET IN FOR 2 MONTHS HE ALSO STATES THAT HE IS STILL HAVE PAIN PLEASE RESPOND

## 2017-01-19 NOTE — Telephone Encounter (Signed)
Called patient.  He was concerned about the "epithelial cells" on his U/A.  Reassured him and explained that these are simply skin cells.  Also reviewed U/S with him.  He would like to be referred to either [facility] or [facility], as [facility] has a 2 month wait.  I will put in a referral to see if we can try either of these places and if they'll see him more quickly.

## 2017-01-19 NOTE — Telephone Encounter (Signed)
I will call today and discuss with him.

## 2017-06-24 ENCOUNTER — Ambulatory Visit (INDEPENDENT_AMBULATORY_CARE_PROVIDER_SITE_OTHER): Payer: 59 | Admitting: Physician Assistant

## 2017-06-24 ENCOUNTER — Encounter: Payer: Self-pay | Admitting: Physician Assistant

## 2017-06-24 VITALS — BP 128/76 | HR 90 | Temp 98.6°F | Resp 16 | Ht 74.0 in | Wt 181.8 lb

## 2017-06-24 DIAGNOSIS — J069 Acute upper respiratory infection, unspecified: Secondary | ICD-10-CM | POA: Diagnosis not present

## 2017-06-24 DIAGNOSIS — R1031 Right lower quadrant pain: Secondary | ICD-10-CM | POA: Diagnosis not present

## 2017-06-24 DIAGNOSIS — Z9889 Other specified postprocedural states: Secondary | ICD-10-CM

## 2017-06-24 DIAGNOSIS — Z8719 Personal history of other diseases of the digestive system: Secondary | ICD-10-CM

## 2017-06-24 MED ORDER — GUAIFENESIN ER 1200 MG PO TB12: 1.0000 | ORAL_TABLET | Freq: Two times a day (BID) | ORAL | 1 refills | Status: AC | PRN

## 2017-06-24 MED ORDER — BENZONATATE 100 MG PO CAPS: 100.0000 mg | ORAL_CAPSULE | Freq: Three times a day (TID) | ORAL | 0 refills | Status: AC | PRN

## 2017-06-24 NOTE — Progress Notes (Signed)
PRIMARY CARE AT [facility] [address], [place] [address] [phone]  Date:  06/24/2017   Name:  Jared Alvarado   DOB:  March 21, 1991   MRN:  [id number]  PCP:  Tobey Grim, MD    History of Present Illness:  Jared Alvarado is a 26 y.o. male patient who presents to PCP with  Chief Complaint  Patient presents with  . URI    runny nose, sore throat, tired x 3 days, hernia repair a couple months ago and pain in sight      3 days, coughing up mucus, and runny nose.  He has a sore throat.  He has been uncomfortable sitting and standing.  Painful with the coughing and sneezing.  No seasonal allergies.  No sob or dyspnea.  No fever.  No nausea.  He took a dayquil which helped.    He has been travelling, co-worker was sick contact  There are no active problems to display for this patient.   Past Medical History:  Diagnosis Date  . Anxiety    states he is "emotionally frail"  . Depression   . Inguinal hernia 10/2016   right  . Jaw clicking   . Pectus excavatum     Past Surgical History:  Procedure Laterality Date  . INGUINAL HERNIA REPAIR Right 11/04/2016   Procedure: RIGHT INGUINAL HERNIA REPAIR;  Surgeon: Harriette Bouillon, MD;  Location: [facility];  Service: General;  Laterality: Right;  . INSERTION OF MESH Right 11/04/2016   Procedure: INSERTION OF MESH;  Surgeon: Harriette Bouillon, MD;  Location: [facility];  Service: General;  Laterality: Right;  . PECTUS BAR REMOVAL  2010  . PECTUS EXCAVATUM REPAIR  2007   Nuss procedure  . TALC PLEURODESIS  2008    Social History  Substance Use Topics  . Smoking status: Never Smoker  . Smokeless tobacco: Never Used  . Alcohol use Yes     Comment: 1-2 drinks/day    History reviewed. No pertinent family history.  Allergies  Allergen Reactions  . Peanut-Containing Drug Products Rash  . Shellfish Allergy Rash    Medication list has been reviewed and updated.  Current Outpatient Prescriptions on  File Prior to Visit  Medication Sig Dispense Refill  . Multiple Vitamin (MULTIVITAMIN) tablet Take 1 tablet by mouth daily.     No current facility-administered medications on file prior to visit.     ROS ROS otherwise unremarkable unless listed above.  Physical Examination: BP 128/76   Pulse 90   Temp 98.6 F (37 C)   Resp 16   Ht  (1.88 m)   Wt 181 lb 12.8 oz (82.5 kg)   SpO2 100%   BMI 23.34 kg/m  Ideal Body Weight: Weight in (lb) to have BMI = 25: 194.3  Physical Exam  Constitutional: He is oriented to person, place, and time. He appears well-developed and well-nourished. No distress.  HENT:  Head: Normocephalic and atraumatic.  Right Ear: Tympanic membrane, external ear and ear canal normal.  Left Ear: Tympanic membrane, external ear and ear canal normal.  Nose: Mucosal edema and rhinorrhea present. Right sinus exhibits no maxillary sinus tenderness and no frontal sinus tenderness. Left sinus exhibits no maxillary sinus tenderness and no frontal sinus tenderness.  Mouth/Throat: No uvula swelling. No oropharyngeal exudate, posterior oropharyngeal edema or posterior oropharyngeal erythema.  Eyes: Pupils are equal, round, and reactive to light. Conjunctivae, EOM and lids are normal. Right eye exhibits normal extraocular  motion. Left eye exhibits normal extraocular motion.  Neck: Trachea normal and full passive range of motion without pain. No edema and no erythema present.  Cardiovascular: Normal rate.   Pulmonary/Chest: Effort normal. No respiratory distress. He has no decreased breath sounds. He has no wheezes. He has no rhonchi.  Abdominal: Hernia confirmed negative in the right inguinal area and confirmed negative in the left inguinal area.  Genitourinary:     Neurological: He is alert and oriented to person, place, and time.  Skin: Skin is warm and dry. He is not diaphoretic.  Psychiatric: He has a normal mood and affect. His behavior is normal.      Assessment and Plan: Jared Alvarado is a 26 y.o. male who is here today for cc of  Chief Complaint  Patient presents with  . URI    runny nose, sore throat, tired x 3 days, hernia repair a couple months ago and pain in sight   I do not think the hernia repair is compromised however this coughing and sneezing is likely aggravating the tissue surrounding repair.  Given supportive treatment to help ward the cough.  Advised anti-inflammatory, as well as using pillows with coughing and support below. Precautions and alarming symptoms to warrant return were given.  Acute upper respiratory infection - Plan: Guaifenesin (MUCINEX MAXIMUM STRENGTH) 1200 MG TB12, benzonatate (TESSALON) 100 MG capsule  Right inguinal pain  Trena Platt, PA-C [facility] 10/5/20189:15 AM

## 2017-06-24 NOTE — Patient Instructions (Addendum)
Please continue to hydrate well Please take ibuprofen  every 6 hours with food, for pain and fever.    Upper Respiratory Infection, Adult Most upper respiratory infections (URIs) are caused by a virus. A URI affects the nose, throat, and upper air passages. The most common type of URI is often called "the common cold." Follow these instructions at home:  Take medicines only as told by your doctor.  Gargle warm saltwater or take cough drops to comfort your throat as told by your doctor.  Use a warm mist humidifier or inhale steam from a shower to increase air moisture. This may make it easier to breathe.  Drink enough fluid to keep your pee (urine) clear or pale yellow.  Eat soups and other clear broths.  Have a healthy diet.  Rest as needed.  Go back to work when your fever is gone or your doctor says it is okay. ? You may need to stay home longer to avoid giving your URI to others. ? You can also wear a face mask and wash your hands often to prevent spread of the virus.  Use your inhaler more if you have asthma.  Do not use any tobacco products, including cigarettes, chewing tobacco, or electronic cigarettes. If you need help quitting, ask your doctor. Contact a doctor if:  You are getting worse, not better.  Your symptoms are not helped by medicine.  You have chills.  You are getting more short of breath.  You have brown or red mucus.  You have yellow or brown discharge from your nose.  You have pain in your face, especially when you bend forward.  You have a fever.  You have puffy (swollen) neck glands.  You have pain while swallowing.  You have white areas in the back of your throat. Get help right away if:  You have very bad or constant: ? Headache. ? Ear pain. ? Pain in your forehead, behind your eyes, and over your cheekbones (sinus pain). ? Chest pain.  You have long-lasting (chronic) lung disease and any of the  following: ? Wheezing. ? Long-lasting cough. ? Coughing up blood. ? A change in your usual mucus.  You have a stiff neck.  You have changes in your: ? Vision. ? Hearing. ? Thinking. ? Mood. This information is not intended to replace advice given to you by your health care provider. Make sure you discuss any questions you have with your health care provider. Document Released: 02/25/2008 Document Revised: 05/11/2016 Document Reviewed: 12/14/2013 Elsevier Interactive Patient Education  2018 [employer].     IF you received an x-ray today, you will receive an invoice from [facility] Radiology. Please contact [facility]iology at [phone] with questions or concerns regarding your invoice.   IF you received labwork today, you will receive an invoice from [place]. Please contact [facility] at [phone] with questions or concerns regarding your invoice.   Our billing staff will not be able to assist you with questions regarding bills from these companies.  You will be contacted with the lab results as soon as they are available. The fastest way to get your results is to activate your My Chart account. Instructions are located on the last page of this paperwork. If you have not heard from [place] regarding the results in 2 weeks, please contact this office.

## 2017-12-30 ENCOUNTER — Encounter: Payer: Self-pay | Admitting: Physician Assistant
